# Patient Record
Sex: Male | Born: 1950 | Race: White | Hispanic: No | Marital: Married | Smoking: Former smoker
Health system: Southern US, Community
[De-identification: ages and names within clinical notes are randomized; demographics above are authoritative.]

## PROBLEM LIST (undated history)

## (undated) DIAGNOSIS — N400 Enlarged prostate without lower urinary tract symptoms: Secondary | ICD-10-CM

## (undated) DIAGNOSIS — E119 Type 2 diabetes mellitus without complications: Secondary | ICD-10-CM

## (undated) DIAGNOSIS — I10 Essential (primary) hypertension: Secondary | ICD-10-CM

## (undated) DIAGNOSIS — E7800 Pure hypercholesterolemia, unspecified: Secondary | ICD-10-CM

## (undated) HISTORY — PX: BLADDER SURGERY: SHX569

---

## 2020-04-22 ENCOUNTER — Other Ambulatory Visit: Payer: Self-pay

## 2020-04-22 ENCOUNTER — Encounter (HOSPITAL_COMMUNITY): Payer: Self-pay | Admitting: Emergency Medicine

## 2020-04-22 ENCOUNTER — Inpatient Hospital Stay (HOSPITAL_COMMUNITY)
Admission: EM | Admit: 2020-04-22 | Discharge: 2020-04-27 | DRG: 867 | Disposition: A | Discharge status: Home / Self Care | Payer: Medicare Other | Attending: Internal Medicine | Admitting: Internal Medicine

## 2020-04-22 DIAGNOSIS — E860 Dehydration: Secondary | ICD-10-CM | POA: Diagnosis present

## 2020-04-22 DIAGNOSIS — Z880 Allergy status to penicillin: Secondary | ICD-10-CM

## 2020-04-22 DIAGNOSIS — Z8249 Family history of ischemic heart disease and other diseases of the circulatory system: Secondary | ICD-10-CM

## 2020-04-22 DIAGNOSIS — Z20822 Contact with and (suspected) exposure to covid-19: Secondary | ICD-10-CM | POA: Diagnosis present

## 2020-04-22 DIAGNOSIS — N179 Acute kidney failure, unspecified: Secondary | ICD-10-CM | POA: Diagnosis present

## 2020-04-22 DIAGNOSIS — Z882 Allergy status to sulfonamides status: Secondary | ICD-10-CM

## 2020-04-22 DIAGNOSIS — I2510 Atherosclerotic heart disease of native coronary artery without angina pectoris: Secondary | ICD-10-CM | POA: Diagnosis present

## 2020-04-22 DIAGNOSIS — K219 Gastro-esophageal reflux disease without esophagitis: Secondary | ICD-10-CM | POA: Diagnosis present

## 2020-04-22 DIAGNOSIS — E782 Mixed hyperlipidemia: Secondary | ICD-10-CM | POA: Diagnosis present

## 2020-04-22 DIAGNOSIS — Z884 Allergy status to anesthetic agent status: Secondary | ICD-10-CM

## 2020-04-22 DIAGNOSIS — K5900 Constipation, unspecified: Secondary | ICD-10-CM | POA: Diagnosis not present

## 2020-04-22 DIAGNOSIS — N400 Enlarged prostate without lower urinary tract symptoms: Secondary | ICD-10-CM | POA: Diagnosis present

## 2020-04-22 DIAGNOSIS — N200 Calculus of kidney: Secondary | ICD-10-CM | POA: Diagnosis present

## 2020-04-22 DIAGNOSIS — R531 Weakness: Secondary | ICD-10-CM | POA: Diagnosis not present

## 2020-04-22 DIAGNOSIS — E872 Acidosis: Secondary | ICD-10-CM | POA: Diagnosis present

## 2020-04-22 DIAGNOSIS — E1165 Type 2 diabetes mellitus with hyperglycemia: Secondary | ICD-10-CM | POA: Diagnosis present

## 2020-04-22 DIAGNOSIS — G9341 Metabolic encephalopathy: Secondary | ICD-10-CM | POA: Diagnosis present

## 2020-04-22 DIAGNOSIS — E1169 Type 2 diabetes mellitus with other specified complication: Secondary | ICD-10-CM | POA: Diagnosis present

## 2020-04-22 DIAGNOSIS — R946 Abnormal results of thyroid function studies: Secondary | ICD-10-CM | POA: Diagnosis present

## 2020-04-22 DIAGNOSIS — A770 Spotted fever due to Rickettsia rickettsii: Principal | ICD-10-CM | POA: Diagnosis present

## 2020-04-22 DIAGNOSIS — K921 Melena: Secondary | ICD-10-CM

## 2020-04-22 DIAGNOSIS — Z7984 Long term (current) use of oral hypoglycemic drugs: Secondary | ICD-10-CM

## 2020-04-22 DIAGNOSIS — Z888 Allergy status to other drugs, medicaments and biological substances status: Secondary | ICD-10-CM

## 2020-04-22 DIAGNOSIS — Z79899 Other long term (current) drug therapy: Secondary | ICD-10-CM

## 2020-04-22 DIAGNOSIS — A09 Infectious gastroenteritis and colitis, unspecified: Secondary | ICD-10-CM | POA: Diagnosis present

## 2020-04-22 DIAGNOSIS — Z883 Allergy status to other anti-infective agents status: Secondary | ICD-10-CM

## 2020-04-22 DIAGNOSIS — I10 Essential (primary) hypertension: Secondary | ICD-10-CM | POA: Diagnosis present

## 2020-04-22 DIAGNOSIS — Z87891 Personal history of nicotine dependence: Secondary | ICD-10-CM

## 2020-04-22 HISTORY — DX: Pure hypercholesterolemia, unspecified: E78.00

## 2020-04-22 HISTORY — DX: Essential (primary) hypertension: I10

## 2020-04-22 HISTORY — DX: Type 2 diabetes mellitus without complications: E11.9

## 2020-04-22 HISTORY — DX: Benign prostatic hyperplasia without lower urinary tract symptoms: N40.0

## 2020-04-22 NOTE — ED Triage Notes (Signed)
Patient states he is feeling foggy headed, diarrhea, having trouble remember things, he has to urinate a lot, weak, and a fever since he got his moderna shot. Patient got the second shot on April 14.,2021.

## 2020-04-22 NOTE — ED Provider Notes (Signed)
[facility]-EMERGENCY DEPT Provider Note   CSN: [id number] Arrival date & time: 04/22/20  2254     History Chief Complaint  Patient presents with  . Fever  . Weakness    Gerald Hughes is a 69 y.o. male with pertinent past medical history of hypertension, diabetes, high cholesterol that presents emergency department today for multiple complaints.  He is currently complaining of generalized weakness, diarrhea, subjective fevers for the past week.  He was brought in by his son who is able to provide most of the history.  He states that on April 16 he got the second Covid vaccine and a couple days later he started becoming febrile. Per chart review,  he was seen in the ER 4 days ago at [facility] and they discharged with diagnosis of dehydration and fever of undetermined origin.  CT abdomen negative.  CT chest negative.  Labs were not concerning, besides white count of 17.2.  Normal hemoglobin.  No major electrolyte derangements.  Creatinine 1.36.He was placed on Ceftin twice daily for the next 7 days.    Triage note states that he has been feeling foggy headed and having trouble remember things, however when I asked son he states that he just has been feeling very weak.Son states that he has been taking his temperature at home for the past week, subjective T-max of 103, and it has been elevated consistently.  He states that his temperature today was 99.2 he has not given him any medications for this. Son states that he has also felt that the the patient was more pale.  Patient states that he feels more dehydrated, even though he has been drinking plenty of water.  Patient has been urinating a lot more frequently.  He has been medication compliant.  Denies any nausea or vomiting.  He states that he did have abdominal pain in his upper abdomen earlier today, however it has resolved.  He has been passing gas.  Patient admits to exertional shortness of breath, which has been occurring  since his Covid vaccine.  He denies any chest pain, leg swelling.  He states that he has been having chills at home with occasional night sweats.  He denies any weight changes.  Patient states that he has been having tarry stool for the past couple of days.  He denies any hematochezia.  He denies any dizziness, headache, vision changes, recent tick bites.   HPI     Past Medical History:  Diagnosis Date  . Diabetes mellitus without complication (HCC)   . High cholesterol   . Hypertension   . Prostate enlargement     There are no problems to display for this patient.   Past Surgical History:  Procedure Laterality Date  . BLADDER SURGERY         History reviewed. No pertinent family history.  Social History   Tobacco Use  . Smoking status: Never Smoker  . Smokeless tobacco: Never Used  Substance Use Topics  . Alcohol use: Not Currently  . Drug use: Not Currently    Home Medications Prior to Admission medications   Medication Sig Start Date End Date Taking? Authorizing Provider  acetaminophen (TYLENOL) 325 MG tablet Take 650 mg by mouth every 6 (six) hours as needed for moderate pain.   Yes [provider]  atorvastatin (LIPITOR) 40 MG tablet Take 40 mg by mouth daily.   Yes [provider]  cefUROXime (CEFTIN) 500 MG tablet Take 500 mg by mouth 2 (  two) times daily with a meal.  04/18/20 04/25/20 Yes [provider]  cetirizine (ZYRTEC) 10 MG tablet Take 10 mg by mouth See admin instructions. 1 tablet daily for 2 weeks and then alternate to fexofenadine   Yes [provider]  cholecalciferol (VITAMIN D3) 25 MCG (1000 UNIT) tablet Take 1,000 Units by mouth in the morning and at bedtime.   Yes [provider]  desoximetasone (TOPICORT) 0.05 % cream Apply 1 application topically 2 (two) times daily as needed (Rash).   Yes [provider]  empagliflozin (JARDIANCE) 25 MG TABS tablet Take 25 mg by mouth daily.   Yes [provider]  ezetimibe (ZETIA) 10 MG tablet Take 10 mg by mouth daily.   Yes [provider]  fexofenadine (ALLEGRA) 180 MG tablet Take 180 mg by mouth See admin instructions. 1 tablet every evening for 2 weeks and alternate to cetirizine   Yes [provider]  Glucosamine 500 MG CAPS Take 1,000 mg by mouth in the morning and at bedtime.   Yes [provider]  lisinopril (ZESTRIL) 20 MG tablet Take 20 mg by mouth daily.   Yes [provider]  metFORMIN (GLUCOPHAGE-XR) 500 MG 24 hr tablet Take 1,000 mg by mouth in the morning and at bedtime.   Yes [provider]  sodium chloride (OCEAN) 0.65 % SOLN nasal spray Place 1 spray into both nostrils as needed for congestion.   Yes [provider]  tacrolimus (PROTOPIC) 0.1 % ointment Apply 1 application topically 2 (two) times daily as needed (itching).    Yes [provider]  tamsulosin (FLOMAX) 0.4 MG CAPS capsule Take 0.8 mg by mouth daily after supper.   Yes [provider]  vitamin B-12 (CYANOCOBALAMIN) 1000 MCG tablet Take 1,000 mcg by mouth in the morning and at bedtime.   Yes [provider]  vitamin C (ASCORBIC ACID) 250 MG tablet Take 250 mg by mouth daily.   Yes [provider]    Allergies    Penicillins, Cortisone, Lidocaine, Mercurial derivatives, Neomycin-polymyxin b gu, Other, Scopolamine, Sulfa antibiotics, Thimerosal, Hydrochlorothiazide, Neomycin, and Procaine  Review of Systems   Review of Systems  Constitutional: Positive for activity change, chills, fatigue and fever. Negative for diaphoresis.  HENT: Negative for congestion, sore throat and trouble swallowing.   Eyes: Negative for pain and visual disturbance.  Respiratory: Negative for cough, shortness of breath and wheezing.   Cardiovascular: Negative for chest pain, palpitations and leg swelling.  Gastrointestinal: Positive for diarrhea. Negative for abdominal distention, abdominal  pain, nausea and vomiting.  Genitourinary: Positive for frequency. Negative for difficulty urinating, dysuria and hematuria.  Musculoskeletal: Negative for back pain, neck pain and neck stiffness.  Skin: Negative for pallor.  Neurological: Positive for weakness. Negative for dizziness, tremors, seizures, syncope, facial asymmetry, speech difficulty, light-headedness, numbness and headaches.    Physical Exam Updated Vital Signs BP (!) 132/58   Pulse 83   Temp 98.1 F (36.7 C) (Oral)   Resp 18   Ht 5' 6.5" (1.689 m)   Wt 77.1 kg   SpO2 95%   BMI 27.03 kg/m   Physical Exam Constitutional:      General: He is not in acute distress.    Appearance: Normal appearance. He is not ill-appearing, toxic-appearing or diaphoretic.  HENT:     Mouth/Throat:     Mouth: Mucous membranes are dry.     Pharynx: Oropharynx is clear.  Eyes:     General: No scleral  icterus.    Extraocular Movements: Extraocular movements intact.     Pupils: Pupils are equal, round, and reactive to light.  Cardiovascular:     Rate and Rhythm: Normal rate and regular rhythm.     Pulses: Normal pulses.     Heart sounds: Normal heart sounds.  Pulmonary:     Effort: Pulmonary effort is normal. No respiratory distress.     Breath sounds: Normal breath sounds. No stridor. No wheezing, rhonchi or rales.  Chest:     Chest wall: No tenderness.  Abdominal:     General: Abdomen is flat. There is no distension.     Palpations: Abdomen is soft.     Tenderness: There is no abdominal tenderness. There is no guarding or rebound.  Musculoskeletal:        General: No swelling or tenderness. Normal range of motion.     Cervical back: Normal range of motion and neck supple. No rigidity or tenderness.     Right lower leg: No edema.     Left lower leg: No edema.  Lymphadenopathy:     Cervical: No cervical adenopathy.  Skin:    General: Skin is warm and dry.     Capillary Refill: Capillary refill takes less than 2 seconds.      Coloration: Skin is pale.  Neurological:     General: No focal deficit present.     Mental Status: He is alert and oriented to person, place, and time. Mental status is at baseline.     Cranial Nerves: No cranial nerve deficit.     Sensory: No sensory deficit.     Motor: No weakness.     Coordination: Coordination normal.  Psychiatric:        Mood and Affect: Mood normal.        Behavior: Behavior normal.        Thought Content: Thought content normal.        Judgment: Judgment normal.     ED Results / Procedures / Treatments   Labs (all labs ordered are listed, but only abnormal results are displayed) Labs Reviewed  COMPREHENSIVE METABOLIC PANEL - Abnormal; Notable for the following components:      Result Value   CO2 18 (*)    Glucose, Bld 223 (*)    BUN 25 (*)    Creatinine, Ser 1.25 (*)    Albumin 2.8 (*)    AST 12 (*)    GFR calc non Af Amer 58 (*)    All other components within normal limits  CBC WITH DIFFERENTIAL/PLATELET - Abnormal; Notable for the following components:   WBC 14.5 (*)    Platelets 414 (*)    Neutro Abs 11.2 (*)    Monocytes Absolute 1.5 (*)    Abs Immature Granulocytes 0.53 (*)    All other components within normal limits  URINALYSIS, ROUTINE W REFLEX MICROSCOPIC - Abnormal; Notable for the following components:   Color, Urine STRAW (*)    Glucose, UA >=500 (*)    Ketones, ur 80 (*)    Protein, ur 30 (*)    All other components within normal limits  CBG MONITORING, ED - Abnormal; Notable for the following components:   Glucose-Capillary 189 (*)    All other components within normal limits  POC OCCULT BLOOD, ED - Abnormal; Notable for the following components:   Fecal Occult Bld POSITIVE (*)    All other components within normal limits  C DIFFICILE QUICK SCREEN W PCR REFLEX  SARS CORONAVIRUS 2 (TAT 6-24 HRS)  CULTURE, BLOOD (ROUTINE X 2)  CULTURE, BLOOD (ROUTINE X 2)  GI PATHOGEN PANEL BY PCR, STOOL  LIPASE, BLOOD  LACTIC ACID, PLASMA    LACTIC ACID, PLASMA  RAPID HIV SCREEN (HIV 1/2 AB+AG)  ROCKY MTN SPOTTED FVR ABS PNL(IGG+IGM)  LYME DISEASE, WESTERN BLOT  POC SARS CORONAVIRUS 2 AG -  ED  TROPONIN I (HIGH SENSITIVITY)  TROPONIN I (HIGH SENSITIVITY)    EKG EKG Interpretation  Date/Time:  Saturday Apr 23 2020 00:08:43 EDT Ventricular Rate:  86 PR Interval:    QRS Duration: 92 QT Interval:  363 QTC Calculation: 435 R Axis:   2 Text Interpretation: Sinus rhythm Low voltage, precordial leads RSR' in V1 or V2, right VCD or RVH No old tracing to compare Confirmed by Ward, Baxter Hire [phone]) on 04/23/2020 2:21:54 AM   Radiology CT Head Wo Contrast  Result Date: 04/23/2020 CLINICAL DATA:  Altered mental status EXAM: CT HEAD WITHOUT CONTRAST TECHNIQUE: Contiguous axial images were obtained from the base of the skull through the vertex without intravenous contrast. COMPARISON:  None. FINDINGS: Brain: No evidence of acute territorial infarction, hemorrhage, hydrocephalus,extra-axial collection or mass lesion/mass effect. There is dilatation the ventricles and sulci consistent with age-related atrophy. Low-attenuation changes in the deep white matter consistent with small vessel ischemia. 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 Vascular: No hyperdense vessel or unexpected calcification. Skull: The skull is intact. No fracture or focal lesion identified. Sinuses/Orbits: The visualized paranasal sinuses and mastoid air cells are clear. The orbits and globes intact. Other: None IMPRESSION: No acute intracranial abnormality. 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 Electronically Signed   By: Jonna Clark M.D.   On: 04/23/2020 00:44   DG Chest Port 1 View  Result Date: 04/23/2020 CLINICAL DATA:  Weakness EXAM: PORTABLE CHEST 1 VIEW COMPARISON:  None. FINDINGS: The heart size and mediastinal contours are within normal limits. Both  lungs are clear. The visualized skeletal structures are unremarkable. IMPRESSION: No active disease. Electronically Signed   By: Jonna Clark M.D.   On: 04/23/2020 00:43    Procedures Procedures (including critical care time)  Medications Ordered in ED Medications  sodium chloride 0.9 % bolus 1,000 mL (0 mLs Intravenous Stopped 04/23/20 0206)  sodium chloride 0.9 % bolus 1,000 mL (0 mLs Intravenous Stopped 04/23/20 0400)    ED Course  I have reviewed the triage vital signs and the nursing notes.  Pertinent labs & imaging results that were available during my care of the patient were reviewed by me and considered in my medical decision making (see chart for details).    MDM Rules/Calculators/A&P                     Gerald Hughes is a 68 y.o. male with pertinent past medical history of hypertension, diabetes, high cholesterol that presents emergency department today for subjective fevers, weakness, diarrhea.  Patient has been worked up by [employer] in the ER 4 days ago with mostly negative work-up.  Differential diagnoses is wide.  Unclear source of leukocytosis subjective fevers.Today work-up reveals tarry stool and positive Hemoccult.  Normal hemoglobin.  CMP without electrolyte derangements.  Negative troponin.  Normal lipase.  Negative Covid.  Negative C. difficile.  Urinalysis shows dehydration with glucose.  Labs pending include blood cultures, Lyme disease, [place] spotted fever, GI pathogen panel, HIV.  EKG interpreted by me  shows sinus rhythm.  Chest x-ray and CT  Head interpreted by me shows no acute abnormalities.  Due to patient's weakness, subjective fevers, melena patient to be admitted for observation.  PA-C Ivar Drape spoke to Dr. Leafy Half who will admit the patient.  Patient agreeable. 2 Liters of fluid given. No need for repeat CT abdomen at this time due to pt's unchanged symptoms and extra radiation.  Discussed case with my attending physician who is agreeable to plan to  consult for admission. 4:51 AM discussed case with hospitalist who agrees to accept care of patient.The patient appears reasonably stabilized for admission considering the current resources, flow, and capabilities available in the ED at this time, and I doubt any other [facility] requiring further screening and/or treatment in the ED prior to admission.  I discussed this case with my attending physician, Dr. Elesa Massed, who cosigned this note including patient's presenting symptoms, physical exam, and planned diagnostics and interventions. Attending physician stated agreement with plan or made changes to plan which were implemented.  Attending physician assessed patient at bedside.   Final Clinical Impression(s) / ED Diagnoses Final diagnoses:  Generalized weakness  Melena    Rx / DC Orders ED Discharge Orders    None       Farrel Gordon, PA-C 04/23/20 0501    Ward, Layla Maw, DO 04/23/20 [phone]

## 2020-04-23 ENCOUNTER — Observation Stay (HOSPITAL_COMMUNITY): Payer: Medicare Other

## 2020-04-23 ENCOUNTER — Emergency Department (HOSPITAL_COMMUNITY): Payer: Medicare Other

## 2020-04-23 ENCOUNTER — Encounter (HOSPITAL_COMMUNITY): Payer: Self-pay | Admitting: Internal Medicine

## 2020-04-23 DIAGNOSIS — K219 Gastro-esophageal reflux disease without esophagitis: Secondary | ICD-10-CM | POA: Diagnosis not present

## 2020-04-23 DIAGNOSIS — G9341 Metabolic encephalopathy: Secondary | ICD-10-CM | POA: Diagnosis not present

## 2020-04-23 DIAGNOSIS — I10 Essential (primary) hypertension: Secondary | ICD-10-CM

## 2020-04-23 DIAGNOSIS — E1165 Type 2 diabetes mellitus with hyperglycemia: Secondary | ICD-10-CM | POA: Diagnosis present

## 2020-04-23 DIAGNOSIS — N400 Enlarged prostate without lower urinary tract symptoms: Secondary | ICD-10-CM

## 2020-04-23 DIAGNOSIS — E1169 Type 2 diabetes mellitus with other specified complication: Secondary | ICD-10-CM

## 2020-04-23 DIAGNOSIS — A09 Infectious gastroenteritis and colitis, unspecified: Secondary | ICD-10-CM | POA: Diagnosis not present

## 2020-04-23 DIAGNOSIS — E782 Mixed hyperlipidemia: Secondary | ICD-10-CM

## 2020-04-23 DIAGNOSIS — R531 Weakness: Secondary | ICD-10-CM

## 2020-04-23 HISTORY — DX: Essential (primary) hypertension: I10

## 2020-04-23 HISTORY — DX: Benign prostatic hyperplasia without lower urinary tract symptoms: N40.0

## 2020-04-23 HISTORY — DX: Type 2 diabetes mellitus with other specified complication: E11.69

## 2020-04-23 LAB — CBC WITH DIFFERENTIAL/PLATELET
Abs Immature Granulocytes: 0.53 10*3/uL — ABNORMAL HIGH (ref 0.00–0.07)
Abs Immature Granulocytes: 0.62 10*3/uL — ABNORMAL HIGH (ref 0.00–0.07)
Basophils Absolute: 0.1 10*3/uL (ref 0.0–0.1)
Basophils Absolute: 0.1 10*3/uL (ref 0.0–0.1)
Basophils Relative: 1 %
Basophils Relative: 1 %
Eosinophils Absolute: 0.4 10*3/uL (ref 0.0–0.5)
Eosinophils Absolute: 0.5 10*3/uL (ref 0.0–0.5)
Eosinophils Relative: 3 %
Eosinophils Relative: 3 %
HCT: 38.2 % — ABNORMAL LOW (ref 39.0–52.0)
HCT: 42.7 % (ref 39.0–52.0)
Hemoglobin: 11.9 g/dL — ABNORMAL LOW (ref 13.0–17.0)
Hemoglobin: 13.4 g/dL (ref 13.0–17.0)
Immature Granulocytes: 4 %
Immature Granulocytes: 5 %
Lymphocytes Relative: 5 %
Lymphocytes Relative: 6 %
Lymphs Abs: 0.7 10*3/uL (ref 0.7–4.0)
Lymphs Abs: 0.8 10*3/uL (ref 0.7–4.0)
MCH: 30.5 pg (ref 26.0–34.0)
MCH: 30.6 pg (ref 26.0–34.0)
MCHC: 31.2 g/dL (ref 30.0–36.0)
MCHC: 31.4 g/dL (ref 30.0–36.0)
MCV: 97 fL (ref 80.0–100.0)
MCV: 98.2 fL (ref 80.0–100.0)
Monocytes Absolute: 1.5 10*3/uL — ABNORMAL HIGH (ref 0.1–1.0)
Monocytes Absolute: 1.5 10*3/uL — ABNORMAL HIGH (ref 0.1–1.0)
Monocytes Relative: 10 %
Monocytes Relative: 11 %
Neutro Abs: 10.1 10*3/uL — ABNORMAL HIGH (ref 1.7–7.7)
Neutro Abs: 11.2 10*3/uL — ABNORMAL HIGH (ref 1.7–7.7)
Neutrophils Relative %: 74 %
Neutrophils Relative %: 77 %
Platelets: 382 10*3/uL (ref 150–400)
Platelets: 414 10*3/uL — ABNORMAL HIGH (ref 150–400)
RBC: 3.89 MIL/uL — ABNORMAL LOW (ref 4.22–5.81)
RBC: 4.4 MIL/uL (ref 4.22–5.81)
RDW: 13.9 % (ref 11.5–15.5)
RDW: 14.2 % (ref 11.5–15.5)
WBC: 13.6 10*3/uL — ABNORMAL HIGH (ref 4.0–10.5)
WBC: 14.5 10*3/uL — ABNORMAL HIGH (ref 4.0–10.5)
nRBC: 0 % (ref 0.0–0.2)
nRBC: 0 % (ref 0.0–0.2)

## 2020-04-23 LAB — URINALYSIS, ROUTINE W REFLEX MICROSCOPIC
Bacteria, UA: NONE SEEN
Bilirubin Urine: NEGATIVE
Glucose, UA: >=500 mg/dL — AB
Hgb urine dipstick: NEGATIVE
Ketones, ur: 80 mg/dL — AB
Leukocytes,Ua: NEGATIVE
Nitrite: NEGATIVE
Protein, ur: 30 mg/dL — AB
Specific Gravity, Urine: 1.024 (ref 1.005–1.030)
pH: 5 (ref 5.0–8.0)

## 2020-04-23 LAB — COMPREHENSIVE METABOLIC PANEL
ALT: 17 U/L (ref 0–44)
AST: 12 U/L — ABNORMAL LOW (ref 15–41)
Albumin: 2.8 g/dL — ABNORMAL LOW (ref 3.5–5.0)
Alkaline Phosphatase: 78 U/L (ref 38–126)
Anion gap: 15 (ref 5–15)
BUN: 25 mg/dL — ABNORMAL HIGH (ref 8–23)
CO2: 18 mmol/L — ABNORMAL LOW (ref 22–32)
Calcium: 10 mg/dL (ref 8.9–10.3)
Chloride: 107 mmol/L (ref 98–111)
Creatinine, Ser: 1.25 mg/dL — ABNORMAL HIGH (ref 0.61–1.24)
GFR calc Af Amer: >60 mL/min (ref >60)
GFR calc non Af Amer: 58 mL/min — ABNORMAL LOW (ref >60)
Glucose, Bld: 223 mg/dL — ABNORMAL HIGH (ref 70–99)
Potassium: 3.9 mmol/L (ref 3.5–5.1)
Sodium: 140 mmol/L (ref 135–145)
Total Bilirubin: 1 mg/dL (ref 0.3–1.2)
Total Protein: 7.5 g/dL (ref 6.5–8.1)

## 2020-04-23 LAB — CBC
HCT: 36.5 % — ABNORMAL LOW (ref 39.0–52.0)
Hemoglobin: 11.4 g/dL — ABNORMAL LOW (ref 13.0–17.0)
MCH: 30.7 pg (ref 26.0–34.0)
MCHC: 31.2 g/dL (ref 30.0–36.0)
MCV: 98.4 fL (ref 80.0–100.0)
Platelets: 346 10*3/uL (ref 150–400)
RBC: 3.71 MIL/uL — ABNORMAL LOW (ref 4.22–5.81)
RDW: 14 % (ref 11.5–15.5)
WBC: 12.3 10*3/uL — ABNORMAL HIGH (ref 4.0–10.5)
nRBC: 0 % (ref 0.0–0.2)

## 2020-04-23 LAB — LACTIC ACID, PLASMA
Lactic Acid, Venous: 1 mmol/L (ref 0.5–1.9)
Lactic Acid, Venous: 1.3 mmol/L (ref 0.5–1.9)

## 2020-04-23 LAB — POC SARS CORONAVIRUS 2 AG -  ED: SARS Coronavirus 2 Ag: NEGATIVE

## 2020-04-23 LAB — C DIFFICILE QUICK SCREEN W PCR REFLEX
C Diff antigen: NEGATIVE
C Diff interpretation: NOT DETECTED
C Diff toxin: NEGATIVE

## 2020-04-23 LAB — CBG MONITORING, ED
Glucose-Capillary: 166 mg/dL — ABNORMAL HIGH (ref 70–99)
Glucose-Capillary: 167 mg/dL — ABNORMAL HIGH (ref 70–99)
Glucose-Capillary: 177 mg/dL — ABNORMAL HIGH (ref 70–99)
Glucose-Capillary: 189 mg/dL — ABNORMAL HIGH (ref 70–99)
Glucose-Capillary: 266 mg/dL — ABNORMAL HIGH (ref 70–99)

## 2020-04-23 LAB — TROPONIN I (HIGH SENSITIVITY)
Troponin I (High Sensitivity): 5 ng/L (ref <18)
Troponin I (High Sensitivity): 5 ng/L (ref <18)

## 2020-04-23 LAB — POC OCCULT BLOOD, ED: Fecal Occult Bld: POSITIVE — AB

## 2020-04-23 LAB — RAPID HIV SCREEN (HIV 1/2 AB+AG)
HIV 1/2 Antibodies: NONREACTIVE
HIV-1 P24 Antigen - HIV24: NONREACTIVE

## 2020-04-23 LAB — HEMOGLOBIN A1C
Hgb A1c MFr Bld: 8.4 % — ABNORMAL HIGH (ref 4.8–5.6)
Mean Plasma Glucose: 194.38 mg/dL

## 2020-04-23 LAB — TSH: TSH: 0.214 u[IU]/mL — ABNORMAL LOW (ref 0.350–4.500)

## 2020-04-23 LAB — SARS CORONAVIRUS 2 (TAT 6-24 HRS): SARS Coronavirus 2: NEGATIVE

## 2020-04-23 LAB — C-REACTIVE PROTEIN: CRP: 30.4 mg/dL — ABNORMAL HIGH (ref <1.0)

## 2020-04-23 LAB — LIPASE, BLOOD: Lipase: 26 U/L (ref 11–51)

## 2020-04-23 LAB — VITAMIN B12: Vitamin B-12: 6547 pg/mL — ABNORMAL HIGH (ref 180–914)

## 2020-04-23 LAB — FOLATE: Folate: 11 ng/mL (ref >5.9)

## 2020-04-23 MED ORDER — LACTATED RINGERS IV SOLN: INTRAVENOUS | Status: DC

## 2020-04-23 MED ORDER — LACTATED RINGERS IV SOLN: INTRAVENOUS | Status: AC

## 2020-04-23 MED ORDER — IOHEXOL 300 MG/ML  SOLN
100.0000 mL | Freq: Once | INTRAMUSCULAR | Status: AC | PRN
  Administered 2020-04-23: 100 mL via INTRAVENOUS

## 2020-04-23 MED ORDER — LISINOPRIL 20 MG PO TABS
20.0000 mg | ORAL_TABLET | Freq: Every day | ORAL | Status: DC
  Administered 2020-04-23 – 2020-04-27 (×5): 20 mg via ORAL
  Filled 2020-04-23 (×5): qty 1

## 2020-04-23 MED ORDER — ONDANSETRON HCL 4 MG/2ML IJ SOLN: 4.0000 mg | Freq: Four times a day (QID) | INTRAMUSCULAR | Status: DC | PRN

## 2020-04-23 MED ORDER — ATORVASTATIN CALCIUM 40 MG PO TABS
40.0000 mg | ORAL_TABLET | Freq: Every day | ORAL | Status: DC
  Administered 2020-04-23 – 2020-04-27 (×5): 40 mg via ORAL
  Filled 2020-04-23 (×5): qty 1

## 2020-04-23 MED ORDER — METRONIDAZOLE IN NACL 5-0.79 MG/ML-% IV SOLN
500.0000 mg | Freq: Three times a day (TID) | INTRAVENOUS | Status: DC
  Administered 2020-04-23 – 2020-04-25 (×7): 500 mg via INTRAVENOUS
  Filled 2020-04-23 (×7): qty 100

## 2020-04-23 MED ORDER — ACETAMINOPHEN 650 MG RE SUPP: 650.0000 mg | Freq: Four times a day (QID) | RECTAL | Status: DC | PRN

## 2020-04-23 MED ORDER — CIPROFLOXACIN IN D5W 400 MG/200ML IV SOLN
400.0000 mg | Freq: Two times a day (BID) | INTRAVENOUS | Status: DC
  Administered 2020-04-23 – 2020-04-25 (×5): 400 mg via INTRAVENOUS
  Filled 2020-04-23 (×5): qty 200

## 2020-04-23 MED ORDER — INSULIN ASPART 100 UNIT/ML ~~LOC~~ SOLN
0.0000 [IU] | Freq: Three times a day (TID) | SUBCUTANEOUS | Status: DC
  Administered 2020-04-23: 8 [IU] via SUBCUTANEOUS
  Administered 2020-04-23 (×3): 3 [IU] via SUBCUTANEOUS
  Administered 2020-04-24: 5 [IU] via SUBCUTANEOUS
  Administered 2020-04-24 (×2): 15 [IU] via SUBCUTANEOUS
  Administered 2020-04-24: 3 [IU] via SUBCUTANEOUS
  Administered 2020-04-25 (×2): 5 [IU] via SUBCUTANEOUS
  Administered 2020-04-25: 11 [IU] via SUBCUTANEOUS
  Administered 2020-04-26 (×2): 5 [IU] via SUBCUTANEOUS
  Administered 2020-04-26: 11 [IU] via SUBCUTANEOUS
  Administered 2020-04-26 – 2020-04-27 (×2): 5 [IU] via SUBCUTANEOUS
  Administered 2020-04-27: 8 [IU] via SUBCUTANEOUS
  Filled 2020-04-23: qty 0.15

## 2020-04-23 MED ORDER — ONDANSETRON HCL 4 MG PO TABS: 4.0000 mg | ORAL_TABLET | Freq: Four times a day (QID) | ORAL | Status: DC | PRN

## 2020-04-23 MED ORDER — EZETIMIBE 10 MG PO TABS
10.0000 mg | ORAL_TABLET | Freq: Every day | ORAL | Status: DC
  Administered 2020-04-23 – 2020-04-27 (×5): 10 mg via ORAL
  Filled 2020-04-23 (×5): qty 1

## 2020-04-23 MED ORDER — PANTOPRAZOLE SODIUM 40 MG IV SOLR
40.0000 mg | Freq: Every day | INTRAVENOUS | Status: DC
  Administered 2020-04-23 – 2020-04-26 (×4): 40 mg via INTRAVENOUS
  Filled 2020-04-23 (×4): qty 40

## 2020-04-23 MED ORDER — SODIUM CHLORIDE (PF) 0.9 % IJ SOLN
INTRAMUSCULAR | Status: AC
  Filled 2020-04-23: qty 50

## 2020-04-23 MED ORDER — ALBUTEROL SULFATE (2.5 MG/3ML) 0.083% IN NEBU: 2.5000 mg | INHALATION_SOLUTION | RESPIRATORY_TRACT | Status: DC | PRN

## 2020-04-23 MED ORDER — SODIUM CHLORIDE 0.9 % IV BOLUS
1000.0000 mL | Freq: Once | INTRAVENOUS | Status: AC
  Administered 2020-04-23: 01:00:00 1000 mL via INTRAVENOUS

## 2020-04-23 MED ORDER — VITAMIN B-12 1000 MCG PO TABS
1000.0000 ug | ORAL_TABLET | Freq: Two times a day (BID) | ORAL | Status: DC
  Filled 2020-04-23: qty 1

## 2020-04-23 MED ORDER — TAMSULOSIN HCL 0.4 MG PO CAPS
0.8000 mg | ORAL_CAPSULE | Freq: Every day | ORAL | Status: DC
  Administered 2020-04-23 – 2020-04-26 (×4): 0.8 mg via ORAL
  Filled 2020-04-23 (×4): qty 2

## 2020-04-23 MED ORDER — SODIUM CHLORIDE 0.9 % IV BOLUS
1000.0000 mL | Freq: Once | INTRAVENOUS | Status: AC
  Administered 2020-04-23: 1000 mL via INTRAVENOUS

## 2020-04-23 MED ORDER — ACETAMINOPHEN 325 MG PO TABS
650.0000 mg | ORAL_TABLET | Freq: Four times a day (QID) | ORAL | Status: DC | PRN
  Administered 2020-04-23 – 2020-04-25 (×3): 650 mg via ORAL
  Filled 2020-04-23 (×3): qty 2

## 2020-04-23 NOTE — Evaluation (Signed)
Physical Therapy Evaluation Patient Details Name: Gerald Hughes MRN: [id number] DOB: 1951/05/24 Today's Date: 04/23/2020   History of Present Illness  Pt admitted with 2 weeks ongoing diarrhea and increasing confusion.  Pt with hx of DM and bladder surgery  Clinical Impression  Pt admitted as above and presenting with functional mobility limitations 2* mild generalized weakness and mild ambulatory balance deficits.  Pt states feeling much better since arrival to ED and currently mobilizing with min guard assist for balance.   Pt should progress well to dc home with family assist.    Follow Up Recommendations No PT follow up    Equipment Recommendations  None recommended by PT    Recommendations for Other Services       Precautions / Restrictions Precautions Precautions: Fall Restrictions Weight Bearing Restrictions: No      Mobility  Bed Mobility Overal bed mobility: Needs Assistance Bed Mobility: Supine to Sit;Sit to Supine     Supine to sit: Supervision Sit to supine: Supervision   General bed mobility comments: Increased time but no physical assist  Transfers Overall transfer level: Needs assistance Equipment used: None Transfers: Sit to/from Stand Sit to Stand: Supervision            Ambulation/Gait Ambulation/Gait assistance: Min guard Gait Distance (Feet): 350 Feet Assistive device: None Gait Pattern/deviations: Step-through pattern;Shuffle;Trunk flexed;Wide base of support Gait velocity: mod pace   General Gait Details: Mild general instability with widened BOS to compensate - no overt LOB.  Pt states "this is better than when I came in but not quite back to normal"  Stairs            Wheelchair Mobility    Modified Rankin (Stroke Patients Only)       Balance Overall balance assessment: Needs assistance Sitting-balance support: No upper extremity supported;Feet supported Sitting balance-Leahy Scale: Good     Standing balance support:  No upper extremity supported Standing balance-Leahy Scale: Fair                               Pertinent Vitals/Pain Pain Assessment: No/denies pain    Home Living Family/patient expects to be discharged to:: Private residence Living Arrangements: Spouse/significant other Available Help at Discharge: Family Type of Home: House Home Access: Stairs to enter Entrance Stairs-Rails: None Technical brewer of Steps: 3 Home Layout: One level Home Equipment: Cane - single point      Prior Function Level of Independence: Independent               Hand Dominance        Extremity/Trunk Assessment   Upper Extremity Assessment Upper Extremity Assessment: Generalized weakness    Lower Extremity Assessment Lower Extremity Assessment: Generalized weakness       Communication   Communication: No difficulties  Cognition Arousal/Alertness: Awake/alert Behavior During Therapy: WFL for tasks assessed/performed Overall Cognitive Status: Within Functional Limits for tasks assessed                                        General Comments      Exercises     Assessment/Plan    PT Assessment Patient needs continued PT services  PT Problem List Decreased strength;Decreased activity tolerance;Decreased balance;Decreased mobility       PT Treatment Interventions Gait training;Stair training;Functional mobility training;Therapeutic activities;Therapeutic exercise;Balance training  PT Goals (Current goals can be found in the Care Plan section)  Acute Rehab PT Goals Patient Stated Goal: Regain IND PT Goal Formulation: With patient Time For Goal Achievement: 05/07/20 Potential to Achieve Goals: Good    Frequency Min 3X/week   Barriers to discharge        Co-evaluation               AM-PAC PT "6 Clicks" Mobility  Outcome Measure Help needed turning from your back to your side while in a flat bed without using bedrails?:  None Help needed moving from lying on your back to sitting on the side of a flat bed without using bedrails?: None Help needed moving to and from a bed to a chair (including a wheelchair)?: A Little Help needed standing up from a chair using your arms (e.g., wheelchair or bedside chair)?: A Little Help needed to walk in hospital room?: A Little Help needed climbing 3-5 steps with a railing? : A Little 6 Click Score: 20    End of Session Equipment Utilized During Treatment: Gait belt Activity Tolerance: Patient tolerated treatment well Patient left: in bed;with call bell/phone within reach;with family/visitor present Nurse Communication: Mobility status PT Visit Diagnosis: Difficulty in walking, not elsewhere classified (R26.2)    Time: 9407-6808 PT Time Calculation (min) (ACUTE ONLY): 22 min   Charges:   PT Evaluation $PT Eval Low Complexity: 1 Low          Mauro Kaufmann PT Acute Rehabilitation Services Pager [phone] Office [phone]   Gerald Hughes 04/23/2020, 1:03 PM

## 2020-04-23 NOTE — ED Notes (Signed)
Pt ambulatory to RR 

## 2020-04-23 NOTE — ED Notes (Addendum)
Pts son reports he usually wear CPAP at night to sleep due to sleep apnea. Pts daughter plans to brings pts at home machine.   thompson MD made aware and pt placed on 2L [place] for comfort until CPAP arrives

## 2020-04-23 NOTE — ED Notes (Addendum)
Pt ambulatory to RR. Pt gown and underwear changed.

## 2020-04-23 NOTE — ED Notes (Signed)
Pt in CT.

## 2020-04-23 NOTE — H&P (Signed)
History and Physical    Gerald Hughes JSH:[id number] DOB: Jan 06, 1951 DOA: 04/22/2020  PCP: Pearson Forster, MD  Patient coming from: Home   Chief Complaint:  Chief Complaint  Patient presents with  . Fever  . Weakness     HPI:  69 year old male with past medical history of benign prostatic hyperplasia, diabetes mellitus type 2, hyperlipidemia, hypertension who [facility] emergency department with complaints of diarrhea and confusion.  Patient is a poor historian due to encephalopathy and therefore the majority the history is been obtained from the son who is at the bedside.  Approximately 2 days after the patient received his second dose of his Covid vaccination (4/14) patient began to experience a combination of sore throat, fevers and diarrhea.  Patient describes diarrhea as watery, dark, almost black in color and occurring 3-5 times daily.  Patient states that his fevers were variable but ranged all the way up to 103 F.    Patient denies any associated abdominal pain, nausea, cough, shortness of breath vomiting, sick contacts, confirmed contacts with COVID-19, dysuria or low back pain.  Several days later patient underwent a skin biopsy of his right ear by his outpatient dermatologist and was placed on a course of Ceftin.  Patient symptoms continue to worsen over the next several days until the patient eventually presented to [facility] emergency department on 4/26.  During that work-up, CT imaging of the chest abdomen pelvis failed to identify a acute infectious process.  COVID-19 PCR testing at that time was negative.  Patient was eventually discharged home and instructed to complete his course of Ceftin.  Patient symptoms of watery dark diarrhea continued to persist over the next 5 days.  Patient continued to complain of intermittent fevers.  As the patient's symptoms continue to persist he also began to develop generalized weakness and lethargy.  Yesterday, the  family reports that the patient became visibly confused and lethargic, prompting the family to bring the patient into [facility] emergency department for evaluation.  Upon evaluation in the emergency department patient was found to have a substantial leukocytosis of 14.5 with stool Hemoccult testing being positive.  Patient was hydrated with 2 L of intravenous isotonic fluids.  Because of patient's ongoing diarrhea with concerns by the ER provider for bleeding, clinical dehydration and encephalopathy the hospitalist group has been called to assess the patient for admission the hospital.    Review of Systems: Unable to fully perform due to patient being lethargic and confused.   Past Medical History:  Diagnosis Date  . BPH (benign prostatic hyperplasia) 04/23/2020  . Diabetes mellitus without complication (HCC)   . Essential hypertension 04/23/2020  . High cholesterol   . Hypertension   . Mixed diabetic hyperlipidemia associated with type 2 diabetes mellitus (HCC) 04/23/2020  . Prostate enlargement     Past Surgical History:  Procedure Laterality Date  . BLADDER SURGERY       reports that he has quit smoking. He has never used smokeless tobacco. He reports previous alcohol use. He reports previous drug use.  Allergies  Allergen Reactions  . Penicillins Anaphylaxis    Other reaction(s): Other (See Comments) "put me in ICU for 5 days" "put me in ICU for 5 days"   . Cortisone Rash  . Lidocaine Rash  . Mercurial Derivatives Rash  . Neomycin-Polymyxin B Gu     Other reaction(s): Other OTHER  . Other Rash  . Scopolamine Rash  . Sulfa Antibiotics Rash  .  Thimerosal Rash  . Hydrochlorothiazide Photosensitivity  . Neomycin Rash  . Procaine     Other reaction(s): Unknown Was told from a allergy test    Family History  Problem Relation Age of Onset  . Heart disease Father      Prior to Admission medications   Medication Sig Start Date End Date Taking? Authorizing  Provider  acetaminophen (TYLENOL) 325 MG tablet Take 650 mg by mouth every 6 (six) hours as needed for moderate pain.   Yes [provider]  atorvastatin (LIPITOR) 40 MG tablet Take 40 mg by mouth daily.   Yes [provider]  cefUROXime (CEFTIN) 500 MG tablet Take 500 mg by mouth 2 (two) times daily with a meal.  04/18/20 04/25/20 Yes [provider]  cetirizine (ZYRTEC) 10 MG tablet Take 10 mg by mouth See admin instructions. 1 tablet daily for 2 weeks and then alternate to fexofenadine   Yes [provider]  cholecalciferol (VITAMIN D3) 25 MCG (1000 UNIT) tablet Take 1,000 Units by mouth in the morning and at bedtime.   Yes [provider]  desoximetasone (TOPICORT) 0.05 % cream Apply 1 application topically 2 (two) times daily as needed (Rash).   Yes [provider]  empagliflozin (JARDIANCE) 25 MG TABS tablet Take 25 mg by mouth daily.   Yes [provider]  ezetimibe (ZETIA) 10 MG tablet Take 10 mg by mouth daily.   Yes [provider]  fexofenadine (ALLEGRA) 180 MG tablet Take 180 mg by mouth See admin instructions. 1 tablet every evening for 2 weeks and alternate to cetirizine   Yes [provider]  Glucosamine 500 MG CAPS Take 1,000 mg by mouth in the morning and at bedtime.   Yes [provider]  lisinopril (ZESTRIL) 20 MG tablet Take 20 mg by mouth daily.   Yes [provider]  metFORMIN (GLUCOPHAGE-XR) 500 MG 24 hr tablet Take 1,000 mg by mouth in the morning and at bedtime.   Yes [provider]  sodium chloride (OCEAN) 0.65 % SOLN nasal spray Place 1 spray into both nostrils as needed for congestion.   Yes [provider]  tacrolimus (PROTOPIC) 0.1 % ointment Apply 1 application topically 2 (two) times daily as needed (itching).    Yes [provider]  tamsulosin (FLOMAX) 0.4 MG CAPS capsule Take 0.8 mg by mouth daily after supper.   Yes [provider]    vitamin B-12 (CYANOCOBALAMIN) 1000 MCG tablet Take 1,000 mcg by mouth in the morning and at bedtime.   Yes [provider]  vitamin C (ASCORBIC ACID) 250 MG tablet Take 250 mg by mouth daily.   Yes [provider]    Physical Exam: Vitals:   04/23/20 0100 04/23/20 0200 04/23/20 0300 04/23/20 0400  BP: 108/76 115/63 128/66 (!) 132/58  Pulse: (!) 119 82 80 83  Resp: 17 18 20 18   Temp:      TempSrc:      SpO2: 95% 92% 96% 95%  Weight:      Height:        Constitutional: Lethargic but arousable and oriented x2,, no associated distress.   Skin: no rashes, no lesions, poor skin turgor noted. Eyes: Pupils are equally reactive to light.  No evidence of scleral icterus or conjunctival pallor.  ENMT: Extremely dry mucous membranes noted.  Posterior pharynx clear of any exudate or lesions.   Neck: normal, supple, no masses, no thyromegaly.  No evidence of jugular venous distension.  Respiratory: clear to auscultation bilaterally, no wheezing, no crackles. Normal respiratory effort. No accessory muscle use.  Cardiovascular: Regular rate and rhythm, no murmurs / rubs / gallops. No extremity edema. 2+ pedal pulses. No carotid bruits.  Chest:   Nontender without crepitus or deformity.   Back:   Nontender without crepitus or deformity. Abdomen: Notable hyperactive bowel sounds.  Abdomen is soft and nontender however.  No evidence of intra-abdominal masses.  Musculoskeletal: No joint deformity upper and lower extremities. Good ROM, no contractures. Normal muscle tone.  Neurologic: Patient quite lethargic but arousable, oriented x2.  Patient is moving all 4 extremities spontaneously.  Sensation is grossly intact.  Patient is responsive to verbal and painful stimuli. Psychiatric: Unable to fully assess due to lethargy.  Labs on Admission: I have personally reviewed following labs and imaging studies -   CBC: Recent Labs  Lab 04/23/20 0005  WBC 14.5*  NEUTROABS 11.2*  HGB 13.4   HCT 42.7  MCV 97.0  PLT 161*   Basic Metabolic Panel: Recent Labs  Lab 04/23/20 0005  NA 140  K 3.9  CL 107  CO2 18*  GLUCOSE 223*  BUN 25*  CREATININE 1.25*  CALCIUM 10.0   GFR: Estimated Creatinine Clearance: 49.8 mL/min (A) (by C-G formula based on SCr of 1.25 mg/dL (H)). Liver Function Tests: Recent Labs  Lab 04/23/20 0005  AST 12*  ALT 17  ALKPHOS 78  BILITOT 1.0  PROT 7.5  ALBUMIN 2.8*   Recent Labs  Lab 04/23/20 0005  LIPASE 26   No results for input(s): AMMONIA in the last 168 hours. Coagulation Profile: No results for input(s): INR, PROTIME in the last 168 hours. Cardiac Enzymes: No results for input(s): CKTOTAL, CKMB, CKMBINDEX, TROPONINI in the last 168 hours. BNP (last 3 results) No results for input(s): PROBNP in the last 8760 hours. HbA1C: No results for input(s): HGBA1C in the last 72 hours. CBG: Recent Labs  Lab 04/23/20 0040  GLUCAP 189*   Lipid Profile: No results for input(s): CHOL, HDL, LDLCALC, TRIG, CHOLHDL, LDLDIRECT in the last 72 hours. Thyroid Function Tests: No results for input(s): TSH, T4TOTAL, FREET4, T3FREE, THYROIDAB in the last 72 hours. Anemia Panel: No results for input(s): VITAMINB12, FOLATE, FERRITIN, TIBC, IRON, RETICCTPCT in the last 72 hours. Urine analysis:    Component Value Date/Time   COLORURINE STRAW (A) 04/23/2020 0006   APPEARANCEUR CLEAR 04/23/2020 0006   LABSPEC 1.024 04/23/2020 0006   PHURINE 5.0 04/23/2020 0006   GLUCOSEU >=500 (A) 04/23/2020 0006   HGBUR NEGATIVE 04/23/2020 0006   BILIRUBINUR NEGATIVE 04/23/2020 0006   KETONESUR 80 (A) 04/23/2020 0006   PROTEINUR 30 (A) 04/23/2020 0006   NITRITE NEGATIVE 04/23/2020 0006   LEUKOCYTESUR NEGATIVE 04/23/2020 0006    Radiological Exams on Admission - Personally Reviewed: CT Head Wo Contrast  Result Date: 04/23/2020 CLINICAL DATA:  Altered mental status EXAM: CT HEAD WITHOUT CONTRAST TECHNIQUE: Contiguous axial images were obtained from the base  of the skull through the vertex without intravenous contrast. COMPARISON:  None. FINDINGS: Brain: No evidence of acute territorial infarction, hemorrhage, hydrocephalus,extra-axial collection or mass lesion/mass effect. There is dilatation the ventricles and sulci consistent with age-related atrophy. Low-attenuation changes in the deep white matter consistent with small vessel ischemia. 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 Vascular: No hyperdense vessel or unexpected calcification. Skull: The skull is intact. No fracture or focal lesion identified. Sinuses/Orbits: The visualized paranasal sinuses and mastoid air cells are clear. The orbits and globes intact.  Other: None IMPRESSION: No acute intracranial abnormality. 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 Electronically Signed   By: Jonna ClarkBindu  Avutu M.D.   On: 04/23/2020 00:44   DG Chest Port 1 View  Result Date: 04/23/2020 CLINICAL DATA:  Weakness EXAM: PORTABLE CHEST 1 VIEW COMPARISON:  None. FINDINGS: The heart size and mediastinal contours are within normal limits. Both lungs are clear. The visualized skeletal structures are unremarkable. IMPRESSION: No active disease. Electronically Signed   By: Jonna ClarkBindu  Avutu M.D.   On: 04/23/2020 00:43    EKG: Personally reviewed.  Rhythm is normal sinus rhythm with heart rate of 82.  No dynamic ST segment changes appreciated.  Assessment/Plan Principal Problem:   Acute infectious diarrhea   Patient presenting with an approximate 2-week history of frequent watery diarrhea  Patient has received several courses of antibiotics in the past several weeks raising concern for possible CDiff colitis - however C. difficile testing here in the emergency room is negative.  CT imaging of the abdomen and pelvis [facility] revealed no evidence of diverticulitis,  appendicitis or abscess  Considering patient's continued reports of fevers with substantial leukocytosis of 14.5 here in the emergency department and ongoing diarrhea I must treat patient for suspected acute infectious diarrhea at this time.  GI PCR panel for infectious organisms is pending.  Will place patient temporarily on empiric regimen of intravenous Flagyl and ciprofloxacin.  Clear liquid diet  Blood cultures obtained  Hydrating patient with intravenous isotonic fluids  If patient clinically worsens will consider repeat CT imaging of the abdomen and pelvis.  Positive Stool Hemoccult   ER provider concerned due to patient's reports of dark watery diarrhea and positive Hemoccult.  While patient is indeed complaining of dark-colored diarrhea, the consistency of the stool is not consistent with melena  BUN is not particularly elevated to support an upper GI bleed.  Hemoglobin is currently unremarkable  As a precaution, will perform several serial CBCs as we hydrate the patient since the patient's H&H is likely hemoconcentrated  Positive stool Hemoccult can be addressed in the outpatient setting.  Will reconsider consulting GI in the inpatient setting if there is a dramatic drop in hemoglobin and hematocrit or any evidence of frank bleeding.  Active Problems:   Acute metabolic encephalopathy   Patient presenting with 24-hour history of worsening lethargy and confusion  Is likely secondary to volume depletion -family already reports the patient is somewhat improving here in the emergency room  Tending to hydrate with intravenous isotonic fluids  TSH, folate and vitamin B12 pending.    Uncontrolled type 2 diabetes mellitus with hyperglycemia, without Spratt-term current use of insulin (HCC)   Patient presenting with substantial hyperglycemia  Diabetes likely more uncontrolled than usual due to active infection  Accu-Cheks before every meal and nightly with sliding  scale insulin  Obtaining hemoglobin A1c  Holding home regimen of oral hypoglycemics    Essential hypertension   Continuing home regimen of antihypertensive therapy    GERD without esophagitis   Patient reports bouts of frequent heartburn, will initiate daily PPI  I do not necessarily think the patient is suffering from an upper GI bleed for reasons noted above.    BPH (benign prostatic hyperplasia)   Continuing home regimen of Flomax    Mixed diabetic hyperlipidemia associated with type 2 diabetes mellitus (HCC)   While Lipitor is known to have a  frequent side effect of diarrhea, patient has been on this medication for 20 years and this is unlikely to be the culprit of his diarrhea.  We will therefore continue this medication, along with his Zetia at this time.     Code Status:  Full code Family Communication: Son at the bedside updated on plan of care  Status is: Observation  The patient remains OBS appropriate and will d/c before 2 midnights.  Dispo: The patient is from: Home              Anticipated d/c is to: Home              Anticipated d/c date is: 2 days              Patient currently is not medically stable to d/c.        Marinda Elk MD [facility] Pager [phone]  If 7PM-7AM, please contact night-coverage [email or web address] Use universal [place] password for that web site. If you do not have the password, please call the hospital operator.  04/23/2020, 5:28 AM

## 2020-04-23 NOTE — ED Notes (Signed)
Thompson MD at bedside. 

## 2020-04-23 NOTE — ED Notes (Signed)
Patient provided with lunch tray

## 2020-04-23 NOTE — Progress Notes (Signed)
I seen and assessed patient and agree with Dr Elinor Parkinson assessment and plan. Patient admitted with weakness, confusion, diarrhea. Covid PCR pending. Check CT abd and pelvis. Continue IV antibiotics, IVF, supportive care. PT/OT.  No charge

## 2020-04-23 NOTE — ED Notes (Signed)
Physical therapy at bedside

## 2020-04-24 DIAGNOSIS — E872 Acidosis: Secondary | ICD-10-CM | POA: Diagnosis present

## 2020-04-24 DIAGNOSIS — E1165 Type 2 diabetes mellitus with hyperglycemia: Secondary | ICD-10-CM | POA: Diagnosis present

## 2020-04-24 DIAGNOSIS — N179 Acute kidney failure, unspecified: Secondary | ICD-10-CM | POA: Diagnosis present

## 2020-04-24 DIAGNOSIS — I10 Essential (primary) hypertension: Secondary | ICD-10-CM | POA: Diagnosis present

## 2020-04-24 DIAGNOSIS — K219 Gastro-esophageal reflux disease without esophagitis: Secondary | ICD-10-CM | POA: Diagnosis present

## 2020-04-24 DIAGNOSIS — Z20822 Contact with and (suspected) exposure to covid-19: Secondary | ICD-10-CM | POA: Diagnosis present

## 2020-04-24 DIAGNOSIS — Z7984 Long term (current) use of oral hypoglycemic drugs: Secondary | ICD-10-CM | POA: Diagnosis not present

## 2020-04-24 DIAGNOSIS — N200 Calculus of kidney: Secondary | ICD-10-CM | POA: Diagnosis present

## 2020-04-24 DIAGNOSIS — Z79899 Other long term (current) drug therapy: Secondary | ICD-10-CM | POA: Diagnosis not present

## 2020-04-24 DIAGNOSIS — A770 Spotted fever due to Rickettsia rickettsii: Secondary | ICD-10-CM | POA: Diagnosis present

## 2020-04-24 DIAGNOSIS — N400 Enlarged prostate without lower urinary tract symptoms: Secondary | ICD-10-CM | POA: Diagnosis present

## 2020-04-24 DIAGNOSIS — E1169 Type 2 diabetes mellitus with other specified complication: Secondary | ICD-10-CM | POA: Diagnosis present

## 2020-04-24 DIAGNOSIS — Z87891 Personal history of nicotine dependence: Secondary | ICD-10-CM | POA: Diagnosis not present

## 2020-04-24 DIAGNOSIS — K5900 Constipation, unspecified: Secondary | ICD-10-CM | POA: Diagnosis not present

## 2020-04-24 DIAGNOSIS — R946 Abnormal results of thyroid function studies: Secondary | ICD-10-CM | POA: Diagnosis present

## 2020-04-24 DIAGNOSIS — Z8249 Family history of ischemic heart disease and other diseases of the circulatory system: Secondary | ICD-10-CM | POA: Diagnosis not present

## 2020-04-24 DIAGNOSIS — Z882 Allergy status to sulfonamides status: Secondary | ICD-10-CM | POA: Diagnosis not present

## 2020-04-24 DIAGNOSIS — Z880 Allergy status to penicillin: Secondary | ICD-10-CM | POA: Diagnosis not present

## 2020-04-24 DIAGNOSIS — R531 Weakness: Secondary | ICD-10-CM | POA: Diagnosis present

## 2020-04-24 DIAGNOSIS — Z883 Allergy status to other anti-infective agents status: Secondary | ICD-10-CM | POA: Diagnosis not present

## 2020-04-24 DIAGNOSIS — G9341 Metabolic encephalopathy: Secondary | ICD-10-CM | POA: Diagnosis present

## 2020-04-24 DIAGNOSIS — E782 Mixed hyperlipidemia: Secondary | ICD-10-CM | POA: Diagnosis present

## 2020-04-24 DIAGNOSIS — A09 Infectious gastroenteritis and colitis, unspecified: Secondary | ICD-10-CM | POA: Diagnosis present

## 2020-04-24 DIAGNOSIS — E860 Dehydration: Secondary | ICD-10-CM | POA: Diagnosis present

## 2020-04-24 DIAGNOSIS — I2510 Atherosclerotic heart disease of native coronary artery without angina pectoris: Secondary | ICD-10-CM | POA: Diagnosis present

## 2020-04-24 LAB — COMPREHENSIVE METABOLIC PANEL
ALT: 12 U/L (ref 0–44)
AST: 13 U/L — ABNORMAL LOW (ref 15–41)
Albumin: 1.9 g/dL — ABNORMAL LOW (ref 3.5–5.0)
Alkaline Phosphatase: 55 U/L (ref 38–126)
Anion gap: 9 (ref 5–15)
BUN: 16 mg/dL (ref 8–23)
CO2: 17 mmol/L — ABNORMAL LOW (ref 22–32)
Calcium: 8.5 mg/dL — ABNORMAL LOW (ref 8.9–10.3)
Chloride: 113 mmol/L — ABNORMAL HIGH (ref 98–111)
Creatinine, Ser: 0.9 mg/dL (ref 0.61–1.24)
GFR calc Af Amer: >60 mL/min (ref >60)
GFR calc non Af Amer: >60 mL/min (ref >60)
Glucose, Bld: 129 mg/dL — ABNORMAL HIGH (ref 70–99)
Potassium: 3.9 mmol/L (ref 3.5–5.1)
Sodium: 139 mmol/L (ref 135–145)
Total Bilirubin: 0.7 mg/dL (ref 0.3–1.2)
Total Protein: 4.9 g/dL — ABNORMAL LOW (ref 6.5–8.1)

## 2020-04-24 LAB — CBC WITH DIFFERENTIAL/PLATELET
Abs Immature Granulocytes: 0.56 10*3/uL — ABNORMAL HIGH (ref 0.00–0.07)
Basophils Absolute: 0.1 10*3/uL (ref 0.0–0.1)
Basophils Relative: 0 %
Eosinophils Absolute: 0.6 10*3/uL — ABNORMAL HIGH (ref 0.0–0.5)
Eosinophils Relative: 5 %
HCT: 35.9 % — ABNORMAL LOW (ref 39.0–52.0)
Hemoglobin: 10.9 g/dL — ABNORMAL LOW (ref 13.0–17.0)
Immature Granulocytes: 5 %
Lymphocytes Relative: 7 %
Lymphs Abs: 0.8 10*3/uL (ref 0.7–4.0)
MCH: 30.4 pg (ref 26.0–34.0)
MCHC: 30.4 g/dL (ref 30.0–36.0)
MCV: 100.3 fL — ABNORMAL HIGH (ref 80.0–100.0)
Monocytes Absolute: 1.4 10*3/uL — ABNORMAL HIGH (ref 0.1–1.0)
Monocytes Relative: 12 %
Neutro Abs: 8.1 10*3/uL — ABNORMAL HIGH (ref 1.7–7.7)
Neutrophils Relative %: 71 %
Platelets: 336 10*3/uL (ref 150–400)
RBC: 3.58 MIL/uL — ABNORMAL LOW (ref 4.22–5.81)
RDW: 14.2 % (ref 11.5–15.5)
WBC: 11.5 10*3/uL — ABNORMAL HIGH (ref 4.0–10.5)
nRBC: 0 % (ref 0.0–0.2)

## 2020-04-24 LAB — GLUCOSE, CAPILLARY
Glucose-Capillary: 155 mg/dL — ABNORMAL HIGH (ref 70–99)
Glucose-Capillary: 361 mg/dL — ABNORMAL HIGH (ref 70–99)
Glucose-Capillary: 230 mg/dL — ABNORMAL HIGH (ref 70–99)
Glucose-Capillary: 412 mg/dL — ABNORMAL HIGH (ref 70–99)

## 2020-04-24 LAB — GLUCOSE, RANDOM: Glucose, Bld: 380 mg/dL — ABNORMAL HIGH (ref 70–99)

## 2020-04-24 LAB — MAGNESIUM: Magnesium: 2 mg/dL (ref 1.7–2.4)

## 2020-04-24 MED ORDER — INSULIN GLARGINE 100 UNIT/ML ~~LOC~~ SOLN
8.0000 [IU] | Freq: Every day | SUBCUTANEOUS | Status: DC
  Administered 2020-04-24: 8 [IU] via SUBCUTANEOUS
  Filled 2020-04-24 (×2): qty 0.08

## 2020-04-24 MED ORDER — BISACODYL 10 MG RE SUPP: 10.0000 mg | Freq: Once | RECTAL | Status: DC

## 2020-04-24 MED ORDER — PRO-STAT SUGAR FREE PO LIQD
30.0000 mL | Freq: Every day | ORAL | Status: DC
  Administered 2020-04-24 – 2020-04-26 (×3): 30 mL via ORAL
  Filled 2020-04-24 (×3): qty 30

## 2020-04-24 MED ORDER — ADULT MULTIVITAMIN W/MINERALS CH
1.0000 | ORAL_TABLET | Freq: Every day | ORAL | Status: DC
  Administered 2020-04-24 – 2020-04-27 (×4): 1 via ORAL
  Filled 2020-04-24 (×4): qty 1

## 2020-04-24 MED ORDER — SORBITOL 70 % SOLN
30.0000 mL | Status: AC
  Administered 2020-04-24 (×2): 30 mL via ORAL
  Filled 2020-04-24 (×2): qty 30

## 2020-04-24 MED ORDER — SODIUM BICARBONATE 650 MG PO TABS
650.0000 mg | ORAL_TABLET | Freq: Two times a day (BID) | ORAL | Status: AC
  Administered 2020-04-24 – 2020-04-25 (×4): 650 mg via ORAL
  Filled 2020-04-24 (×4): qty 1

## 2020-04-24 MED ORDER — BOOST / RESOURCE BREEZE PO LIQD CUSTOM
1.0000 | Freq: Three times a day (TID) | ORAL | Status: DC
  Administered 2020-04-24 – 2020-04-25 (×3): 1 via ORAL

## 2020-04-24 NOTE — Progress Notes (Signed)
Initial Nutrition Assessment  RD working remotely.   DOCUMENTATION CODES:   Not applicable  INTERVENTION:  - continue Boost Breeze TID, each supplement provides 250 kcal and 9 grams of protein. - will order Magic Cup with lunch meals, each supplement provides 290 kcal and 9 grams of protein. - will order 30 mL Prostat BID, each supplement provides 100 kcal and 15 grams of protein. - will order daily multivitamin with minerals.    NUTRITION DIAGNOSIS:   Inadequate oral intake related to acute illness, diarrhea as evidenced by per patient/family report.  GOAL:   Patient will meet greater than or equal to 90% of their needs  MONITOR:   PO intake, Supplement acceptance, Diet advancement, Labs, Weight trends  REASON FOR ASSESSMENT:   Malnutrition Screening Tool  ASSESSMENT:   69 year old male with medical history of BPH, type 2 DM, hyperlipidemia, and HTN. He presented to the ED with complaints of diarrhea and confusion. In the ED, information was obtained from patient's son who stated that patient received second COVID vaccine on 4/14 and after that time developed sore throat, fever, and diarrhea 3-5 times/day. Patient had skin biopsy near R ear a few days after vaccine. Patient went to the ED on 4/26 but was discharged home. He returned to the ED on 4/30 as symptoms continued/worsened.  Diet advanced from CLD to FLD today at 0750; no intakes documented since admission. Weight in the ED on 4/30 was 170 lb, which appears to be a stated weight. Weight on 04/18/20 at [facility] was 158 lb. No other weight hx available in the chart. No documentation in the flow sheet concerning edema.  Per notes: - acute infectious diarrhea with frequent episodes x2 weeks - c.diff negative; pending GI PCR panel - acute metabolic encephalopathy - Observation status   Labs reviewed; CBG: 155 mg/dl, Cl: 937 mmol/l, Ca: 8.5 mg/dl. Medications reviewed; sliding scale novolog, 40 mg IV protonix/day, 650 mg  oral sodium bicarb BID.     NUTRITION - FOCUSED PHYSICAL EXAM:  unable to complete at this time.   Diet Order:   Diet Order            Diet full liquid Room service appropriate? Yes; Fluid consistency: Thin  Diet effective now              EDUCATION NEEDS:   Not appropriate for education at this time  Skin:  Skin Assessment: Reviewed RN Assessment  Last BM:  5/2  Height:   Ht Readings from Last 1 Encounters:  04/22/20 5' 6.5" (1.689 m)    Weight:   Wt Readings from Last 1 Encounters:  04/22/20 77.1 kg    Estimated Nutritional Needs:  Kcal:  1900-2100 kcal Protein:  80-95 grams Fluid:  >/= 2.5 L/day     Trenton Gammon, MS, RD, LDN, CNSC Inpatient Clinical Dietitian RD pager # available in AMION  After hours/weekend pager # available in [facility]

## 2020-04-24 NOTE — Progress Notes (Signed)
PROGRESS NOTE    Gerald Hughes  OFB:[id number] DOB: [birth year] DOA: 04/22/2020 PCP: Pearson Forster, MD    Chief Complaint  Patient presents with   Fever   Weakness    Brief Narrative:  Patient 69 year old gentleman history of BPH, type 2 diabetes, hyperlipidemia, hypertension presented with diarrhea confusion lethargy and weakness.  COVID-19 PCR negative.  C. difficile PCR negative.  CT abdomen and pelvis with no acute findings to explain diarrhea fever, moderate burden of stool throughout the colon, nonobstructive left nephrolithiasis, coronary artery disease.  GI pathogen panel pending.  Patient placed empirically on IV ciprofloxacin and IV Flagyl for presumed infectious diarrhea.  Patient also placed on IV fluids for dehydration.   Assessment & Plan:   Principal Problem:   Acute infectious diarrhea Active Problems:   Acute metabolic encephalopathy   Uncontrolled type 2 diabetes mellitus with hyperglycemia, without Retzloff-term current use of insulin (HCC)   BPH (benign prostatic hyperplasia)   Mixed diabetic hyperlipidemia associated with type 2 diabetes mellitus (HCC)   Essential hypertension   GERD without esophagitis  1 acute presumed infectious diarrhea Patient had presented with weakness, frequent watery diarrhea, noted to have received several courses of antibiotics in the past several weeks raising concern for C. difficile colitis.  C. difficile PCR was negative.  GI pathogen panel pending.  Leukocytosis trending down.  CT abdomen and pelvis done negative for any acute abnormalities however did show moderate stool burden.  Concerned that patient may have watery stool seeping around moderate stool burden.  Patient improving clinically with IV fluids and empirically on IV ciprofloxacin and IV Flagyl.  We will give some laxatives for moderate stool burden and follow.  May need a enema.  Continue supportive care.  Advance diet to a full liquid diet and if tolerated likely to a  soft diet tomorrow.  2.  Acute metabolic encephalopathy Patient had presented with a 1 day history of worsening lethargy and confusion felt likely secondary to volume depletion in the setting of probable infectious diarrhea.  Vitamin B12 levels elevated at 6547.  TSH at 0.214.  Folate within normal limits at 11.  Improving clinically.  Continue empiric IV antibiotics.  Supportive care.  3.  Acidosis Bicarb tablets.  4.  Type 2 diabetes mellitus with hyperglycemia Hemoglobin A1c 8.4.  CBG of 155 this morning and 361 around lunchtime.  Placed on Givler-acting Lantus 80 units daily.  Continue sliding scale insulin.  Continue to hold oral hypoglycemic agents.  Follow.  5.  BPH Flomax.  6.  Hyperlipidemia Continue statin, Zetia.  7.  Abnormal TSH TSH noted at 0.214.  Will likely need repeat thyroid studies done in the outpatient setting post discharge.  8.  GERD Continue PPI.  9.  Hypertension Continue lisinopril.  10.  Generalized weakness Likely secondary to problem #1.  Slowly improving.  PT/OT.   DVT prophylaxis: SCDs Code Status: Full Family Communication: Updated patient, wife, daughter at bedside. Disposition:   Status is: Observation    Dispo: The patient is from: Home              Anticipated d/c is to: Home              Anticipated d/c date is: To be determined hopefully 1 to 2 days.              Patient currently on IV antibiotics.       Consultants:   None  Procedures:   CT head 04/23/2020  CT abdomen and pelvis 04/23/2020  Antimicrobials:   IV ciprofloxacin 04/23/2020  IV Flagyl 04/23/2020   Subjective: In bed with CPAP on.  Alert.  Denies any chest pain or shortness of breath.  Stated had a very small stool.  Denies any abdominal pain.  Tolerating clears.  Objective: Vitals:   04/24/20 0200 04/24/20 0202 04/24/20 0251 04/24/20 0544  BP: (!) 99/58  (!) 103/56 112/65  Pulse: 77  64 64  Resp: 17  17 16   Temp:  97.6 F (36.4 C) 98.1 F (36.7 C)  98 F (36.7 C)  TempSrc:  Oral Oral   SpO2: 93%  95% 100%  Weight:      Height:        Intake/Output Summary (Last 24 hours) at 04/24/2020 0947 Last data filed at 04/24/2020 0918 Gross per 24 hour  Intake 1797.39 ml  Output 800 ml  Net 997.39 ml   Filed Weights   04/22/20 2312  Weight: 77.1 kg    Examination:  General exam: Appears calm and comfortable  Respiratory system: Clear to auscultation. Respiratory effort normal. Cardiovascular system: S1 & S2 heard, RRR. No JVD, murmurs, rubs, gallops or clicks. No pedal edema. Gastrointestinal system: Abdomen is nondistended, soft and nontender. No organomegaly or masses felt. Normal bowel sounds heard. Central nervous system: Alert and oriented. No focal neurological deficits. Extremities: Symmetric 5 x 5 power. Skin: No rashes, lesions or ulcers Psychiatry: Judgement and insight appear normal. Mood & affect appropriate.     Data Reviewed: I have personally reviewed following labs and imaging studies  CBC: Recent Labs  Lab 04/23/20 0005 04/23/20 0600 04/23/20 1134 04/24/20 0350  WBC 14.5* 13.6* 12.3* 11.5*  NEUTROABS 11.2* 10.1*  --  8.1*  HGB 13.4 11.9* 11.4* 10.9*  HCT 42.7 38.2* 36.5* 35.9*  MCV 97.0 98.2 98.4 100.3*  PLT 414* 382 346 336    Basic Metabolic Panel: Recent Labs  Lab 04/23/20 0005 04/24/20 0350  NA 140 139  K 3.9 3.9  CL 107 113*  CO2 18* 17*  GLUCOSE 223* 129*  BUN 25* 16  CREATININE 1.25* 0.90  CALCIUM 10.0 8.5*  MG  --  2.0    GFR: Estimated Creatinine Clearance: 69.2 mL/min (by C-G formula based on SCr of 0.9 mg/dL).  Liver Function Tests: Recent Labs  Lab 04/23/20 0005 04/24/20 0350  AST 12* 13*  ALT 17 12  ALKPHOS 78 55  BILITOT 1.0 0.7  PROT 7.5 4.9*  ALBUMIN 2.8* 1.9*    CBG: Recent Labs  Lab 04/23/20 0814 04/23/20 1130 04/23/20 1708 04/23/20 2226 04/24/20 0739  GLUCAP 166* 167* 177* 266* 155*     Recent Results (from the past 240 hour(s))  C Difficile  Quick Screen w PCR reflex     Status: None   Collection Time: 04/23/20 12:16 AM   Specimen: Stool  Result Value Ref Range Status   C Diff antigen NEGATIVE NEGATIVE Final   C Diff toxin NEGATIVE NEGATIVE Final   C Diff interpretation No C. difficile detected.  Final    Comment: Performed at [facility], [address], [place], [place]  SARS CORONAVIRUS 2 (TAT 6-24 HRS) Nasopharyngeal Nasopharyngeal Swab     Status: None   Collection Time: 04/23/20  2:04 AM   Specimen: Nasopharyngeal Swab  Result Value Ref Range Status   SARS Coronavirus 2 NEGATIVE NEGATIVE Final    Comment: (NOTE) SARS-CoV-2 target nucleic acids are NOT DETECTED. The SARS-CoV-2 RNA is generally detectable  in upper and lower respiratory specimens during the acute phase of infection. Negative results do not preclude SARS-CoV-2 infection, do not rule out co-infections with other pathogens, and should not be used as the sole basis for treatment or other patient management decisions. Negative results must be combined with clinical observations, patient history, and epidemiological information. The expected result is Negative. Fact Sheet for Patients: SugarRoll.be Fact Sheet for Healthcare Providers: https://www.woods-mathews.com/ This test is not yet approved or cleared by the [place] FDA and  has been authorized for detection and/or diagnosis of SARS-CoV-2 by FDA under an Emergency Use Authorization (EUA). This EUA will remain  in effect (meaning this test can be used) for the duration of the COVID-19 declaration under Section 56 4(b)(1) of the Act, 21 U.S.C. section 360bbb-3(b)(1), unless the authorization is terminated or revoked sooner. Performed at [facility], [place] [address], [place], [place] [address]   Blood culture (routine x 2)     Status: None (Preliminary result)   Collection Time: 04/23/20  2:30 AM   Specimen: BLOOD RIGHT  HAND  Result Value Ref Range Status   Specimen Description   Final    BLOOD RIGHT HAND Performed at [facility] [address] [place], [place] [address]    Special Requests   Final    BOTTLES DRAWN AEROBIC AND ANAEROBIC Blood Culture adequate volume Performed at [facility] [address], [place], [place] [address]    Culture   Final    NO GROWTH 1 DAY Performed at [facility], [place] [address] [place], [place] [address]    Report Status PENDING  Incomplete  Blood culture (routine x 2)     Status: None (Preliminary result)   Collection Time: 04/23/20  2:30 AM   Specimen: BLOOD  Result Value Ref Range Status   Specimen Description   Final    BLOOD RIGHT ARM Performed at [facility] [address] [place], [place] [address]    Special Requests   Final    BOTTLES DRAWN AEROBIC AND ANAEROBIC Blood Culture adequate volume Performed at [facility] [address], [place], [place] [address]    Culture   Final    NO GROWTH 1 DAY Performed at [facility], [place] [address], [place], [place] [address]    Report Status PENDING  Incomplete         Radiology Studies: CT Head Wo Contrast  Result Date: 04/23/2020 CLINICAL DATA:  Altered mental status EXAM: CT HEAD WITHOUT CONTRAST TECHNIQUE: Contiguous axial images were obtained from the base of the skull through the vertex without intravenous contrast. COMPARISON:  None. FINDINGS: Brain: No evidence of acute territorial infarction, hemorrhage, hydrocephalus,extra-axial collection or mass lesion/mass effect. There is dilatation the ventricles and sulci consistent with age-related atrophy. Low-attenuation changes in the deep white matter consistent with small vessel ischemia. 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 Vascular: No hyperdense vessel or unexpected calcification. Skull: The skull is intact. No fracture or focal lesion  identified. Sinuses/Orbits: The visualized paranasal sinuses and mastoid air cells are clear. The orbits and globes intact. Other: None IMPRESSION: No acute intracranial abnormality. 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 Electronically Signed   By: Prudencio Pair M.D.   On: 04/23/2020 00:44   CT ABDOMEN PELVIS W CONTRAST  Result Date: 04/23/2020 CLINICAL DATA:  Diarrhea, fever, confusion  EXAM: CT ABDOMEN AND PELVIS WITH CONTRAST TECHNIQUE: Multidetector CT imaging of the abdomen and pelvis was performed using the standard protocol following bolus administration of intravenous contrast. CONTRAST:  OMNIPAQUE IOHEXOL 300 MG/ML  SOLN COMPARISON:  None. FINDINGS: Lower chest: No acute abnormality. Three-vessel coronary artery calcifications. Hepatobiliary: No solid liver abnormality is seen. No gallstones, gallbladder wall thickening, or biliary dilatation. Pancreas: Unremarkable. No pancreatic ductal dilatation or surrounding inflammatory changes. Spleen: Normal in size without significant abnormality. Adrenals/Urinary Tract: Adrenal glands are unremarkable. Multiple nonobstructive left renal calculi. No hydronephrosis. Bladder is unremarkable. Stomach/Bowel: Stomach is within normal limits. Appendix appears normal. No evidence of bowel wall thickening, distention, or inflammatory changes. Moderate burden of stool throughout the colon. Vascular/Lymphatic: Aortic atherosclerosis. No enlarged abdominal or pelvic lymph nodes. Reproductive: No mass or other significant abnormality. Other: Status post right inguinal hernia repair. No abdominopelvic ascites. Musculoskeletal: No acute or significant osseous findings. IMPRESSION: 1. No acute CT findings of the abdomen or pelvis to explain diarrhea or fever. Moderate burden of stool throughout the colon. 2.  Nonobstructive left nephrolithiasis. 3.  Coronary artery disease.   Aortic Atherosclerosis (ICD10-I70.0). Electronically Signed   By: Lauralyn Primes M.D.   On: 04/23/2020 14:19   DG Chest Port 1 View  Result Date: 04/23/2020 CLINICAL DATA:  Weakness EXAM: PORTABLE CHEST 1 VIEW COMPARISON:  None. FINDINGS: The heart size and mediastinal contours are within normal limits. Both lungs are clear. The visualized skeletal structures are unremarkable. IMPRESSION: No active disease. Electronically Signed   By: Jonna Clark M.D.   On: 04/23/2020 00:43        Scheduled Meds:  atorvastatin  40 mg Oral Daily   ezetimibe  10 mg Oral Daily   feeding supplement  1 Container Oral TID BM   insulin aspart  0-15 Units Subcutaneous TID AC & HS   lisinopril  20 mg Oral Daily   pantoprazole (PROTONIX) IV  40 mg Intravenous Daily   sodium bicarbonate  650 mg Oral BID   tamsulosin  0.8 mg Oral QPC supper   Continuous Infusions:  ciprofloxacin 400 mg (04/24/20 0809)   lactated ringers 125 mL/hr at 04/24/20 0600   metronidazole 500 mg (04/24/20 0655)     LOS: 0 days    Time spent: 35 minutes    Ramiro Harvest, MD [facility]   To contact the attending provider between 7A-7P or the covering provider during after hours 7P-7A, please log into the web site [email or web address] and access using universal [place] password for that web site. If you do not have the password, please call the hospital operator.  04/24/2020, 9:47 AM

## 2020-04-24 NOTE — Plan of Care (Signed)
  Problem: Clinical Measurements: Goal: Cardiovascular complication will be avoided Outcome: Progressing   Problem: Activity: Goal: Risk for activity intolerance will decrease Outcome: Progressing   Problem: Nutrition: Goal: Adequate nutrition will be maintained Outcome: Progressing   Problem: Coping: Goal: Level of anxiety will decrease Outcome: Progressing   Problem: Elimination: Goal: Will not experience complications related to urinary retention Outcome: Progressing   

## 2020-04-24 NOTE — Progress Notes (Signed)
RT set up patients home cpap

## 2020-04-25 DIAGNOSIS — A09 Infectious gastroenteritis and colitis, unspecified: Secondary | ICD-10-CM | POA: Diagnosis not present

## 2020-04-25 DIAGNOSIS — R531 Weakness: Secondary | ICD-10-CM | POA: Diagnosis not present

## 2020-04-25 DIAGNOSIS — N400 Enlarged prostate without lower urinary tract symptoms: Secondary | ICD-10-CM | POA: Diagnosis not present

## 2020-04-25 DIAGNOSIS — G9341 Metabolic encephalopathy: Secondary | ICD-10-CM | POA: Diagnosis not present

## 2020-04-25 LAB — CBC
HCT: 37.9 % — ABNORMAL LOW (ref 39.0–52.0)
Hemoglobin: 12.1 g/dL — ABNORMAL LOW (ref 13.0–17.0)
MCH: 30.5 pg (ref 26.0–34.0)
MCHC: 31.9 g/dL (ref 30.0–36.0)
MCV: 95.5 fL (ref 80.0–100.0)
Platelets: 452 10*3/uL — ABNORMAL HIGH (ref 150–400)
RBC: 3.97 MIL/uL — ABNORMAL LOW (ref 4.22–5.81)
RDW: 14.3 % (ref 11.5–15.5)
WBC: 13.4 10*3/uL — ABNORMAL HIGH (ref 4.0–10.5)
nRBC: 0 % (ref 0.0–0.2)

## 2020-04-25 LAB — GLUCOSE, CAPILLARY
Glucose-Capillary: 217 mg/dL — ABNORMAL HIGH (ref 70–99)
Glucose-Capillary: 315 mg/dL — ABNORMAL HIGH (ref 70–99)
Glucose-Capillary: 234 mg/dL — ABNORMAL HIGH (ref 70–99)
Glucose-Capillary: 403 mg/dL — ABNORMAL HIGH (ref 70–99)

## 2020-04-25 LAB — BASIC METABOLIC PANEL
Anion gap: 10 (ref 5–15)
BUN: 14 mg/dL (ref 8–23)
CO2: 21 mmol/L — ABNORMAL LOW (ref 22–32)
Calcium: 9.2 mg/dL (ref 8.9–10.3)
Chloride: 112 mmol/L — ABNORMAL HIGH (ref 98–111)
Creatinine, Ser: 1.06 mg/dL (ref 0.61–1.24)
GFR calc Af Amer: >60 mL/min (ref >60)
GFR calc non Af Amer: >60 mL/min (ref >60)
Glucose, Bld: 202 mg/dL — ABNORMAL HIGH (ref 70–99)
Potassium: 3.4 mmol/L — ABNORMAL LOW (ref 3.5–5.1)
Sodium: 143 mmol/L (ref 135–145)

## 2020-04-25 LAB — GLUCOSE, RANDOM: Glucose, Bld: 443 mg/dL — ABNORMAL HIGH (ref 70–99)

## 2020-04-25 LAB — MAGNESIUM: Magnesium: 2.1 mg/dL (ref 1.7–2.4)

## 2020-04-25 MED ORDER — INSULIN ASPART 100 UNIT/ML ~~LOC~~ SOLN
5.0000 [IU] | Freq: Once | SUBCUTANEOUS | Status: AC
  Administered 2020-04-25: 5 [IU] via SUBCUTANEOUS

## 2020-04-25 MED ORDER — INSULIN ASPART 100 UNIT/ML ~~LOC~~ SOLN
4.0000 [IU] | Freq: Three times a day (TID) | SUBCUTANEOUS | Status: DC
  Administered 2020-04-25 – 2020-04-27 (×8): 4 [IU] via SUBCUTANEOUS

## 2020-04-25 MED ORDER — POTASSIUM CHLORIDE CRYS ER 20 MEQ PO TBCR
40.0000 meq | EXTENDED_RELEASE_TABLET | Freq: Once | ORAL | Status: AC
  Administered 2020-04-25: 08:00:00 40 meq via ORAL
  Filled 2020-04-25: qty 2

## 2020-04-25 MED ORDER — METRONIDAZOLE 500 MG PO TABS
500.0000 mg | ORAL_TABLET | Freq: Three times a day (TID) | ORAL | Status: DC
  Administered 2020-04-25 – 2020-04-26 (×3): 500 mg via ORAL
  Filled 2020-04-25 (×3): qty 1

## 2020-04-25 MED ORDER — INSULIN GLARGINE 100 UNIT/ML ~~LOC~~ SOLN
12.0000 [IU] | Freq: Every day | SUBCUTANEOUS | Status: DC
  Administered 2020-04-25: 12 [IU] via SUBCUTANEOUS
  Filled 2020-04-25 (×2): qty 0.12

## 2020-04-25 MED ORDER — CIPROFLOXACIN HCL 500 MG PO TABS
500.0000 mg | ORAL_TABLET | Freq: Two times a day (BID) | ORAL | Status: DC
  Administered 2020-04-25 – 2020-04-26 (×2): 500 mg via ORAL
  Filled 2020-04-25 (×3): qty 1

## 2020-04-25 NOTE — Progress Notes (Signed)
Physical Therapy Treatment Patient Details Name: Gerald Hughes MRN: [id number] DOB: 1951-05-30 Today's Date: 04/25/2020    History of Present Illness Pt admitted with 2 weeks ongoing diarrhea and increasing confusion.  Pt with hx of DM and bladder surgery    PT Comments    Pt is making steady progress with mobility. Pt is mod independent with bed mobility. Pt is walking on the unit with supervision secondary to 1 LOB while leaning over the bed to place a pillow on the bed. Pt will have assistance at home when needed. Recommend d/c home when medically stable. Pt will continue to benefit from acute skilled PT until d/c home or mod Indep goals met.  Follow Up Recommendations  No PT follow up     Equipment Recommendations  None recommended by PT    Recommendations for Other Services       Precautions / Restrictions Precautions Precautions: Fall    Mobility  Bed Mobility Overal bed mobility: Modified Independent Bed Mobility: Supine to Sit     Supine to sit: Modified independent (Device/Increase time) Sit to supine: Modified independent (Device/Increase time)      Transfers Overall transfer level: Needs assistance   Transfers: Sit to/from Stand Sit to Stand: Supervision            Ambulation/Gait Ambulation/Gait assistance: Supervision Gait Distance (Feet): 350 Feet Assistive device: None Gait Pattern/deviations: Step-through pattern;Shuffle;Trunk flexed;Wide base of support Gait velocity: decreased   General Gait Details: Pt had 1 LOB at bed when leaning over to put neck pillow on the bed. No LOB during gait on the unit; therefore, kept pt as supervision   Stairs             Wheelchair Mobility    Modified Rankin (Stroke Patients Only)       Balance Overall balance assessment: Needs assistance Sitting-balance support: No upper extremity supported Sitting balance-Leahy Scale: Normal     Standing balance support: No upper extremity  supported Standing balance-Leahy Scale: Good                              Cognition Arousal/Alertness: Awake/alert Behavior During Therapy: WFL for tasks assessed/performed Overall Cognitive Status: Within Functional Limits for tasks assessed                                        Exercises      General Comments        Pertinent Vitals/Pain Pain Assessment: No/denies pain    Home Living                      Prior Function            PT Goals (current goals can now be found in the care plan section) Progress towards PT goals: Progressing toward goals    Frequency    Min 3X/week      PT Plan Current plan remains appropriate    Co-evaluation              AM-PAC PT "6 Clicks" Mobility   Outcome Measure  Help needed turning from your back to your side while in a flat bed without using bedrails?: None Help needed moving from lying on your back to sitting on the side of a flat bed without using bedrails?: None Help needed moving  to and from a bed to a chair (including a wheelchair)?: None Help needed standing up from a chair using your arms (e.g., wheelchair or bedside chair)?: None Help needed to walk in hospital room?: A Little Help needed climbing 3-5 steps with a railing? : A Little 6 Click Score: 22    End of Session   Activity Tolerance: Patient tolerated treatment well Patient left: in bed;with call bell/phone within reach;with family/visitor present Nurse Communication: Mobility status PT Visit Diagnosis: Difficulty in walking, not elsewhere classified (R26.2)     Time: 1345-1401 PT Time Calculation (min) (ACUTE ONLY): 16 min  Charges:  $Gait Training: 8-22 mins                      Theodoro Grist [place] 04/25/2020, 2:09 PM

## 2020-04-25 NOTE — Progress Notes (Signed)
Notified [facility] Cherylin Mylar about pts CBG of 403. New orders to not give sliding scale dose of 15 Units but to give only 5 units novolog now and recheck CBG in about 3 hours. New orders implemented.

## 2020-04-25 NOTE — Progress Notes (Signed)
PROGRESS NOTE    Gerald Hughes  JQG:[id number] DOB: 1951-09-26 DOA: 04/22/2020 PCP: Pearson Forster, MD    Chief Complaint  Patient presents with  . Fever  . Weakness    Brief Narrative:  Patient 69 year old gentleman history of BPH, type 2 diabetes, hyperlipidemia, hypertension presented with diarrhea confusion lethargy and weakness.  COVID-19 PCR negative.  C. difficile PCR negative.  CT abdomen and pelvis with no acute findings to explain diarrhea fever, moderate burden of stool throughout the colon, nonobstructive left nephrolithiasis, coronary artery disease.  GI pathogen panel pending.  Patient placed empirically on IV ciprofloxacin and IV Flagyl for presumed infectious diarrhea.  Patient also placed on IV fluids for dehydration.   Assessment & Plan:   Principal Problem:   Acute infectious diarrhea Active Problems:   Acute metabolic encephalopathy   Uncontrolled type 2 diabetes mellitus with hyperglycemia, without [place]-term current use of insulin (HCC)   BPH (benign prostatic hyperplasia)   Mixed diabetic hyperlipidemia associated with type 2 diabetes mellitus (HCC)   Essential hypertension   GERD without esophagitis  1 acute presumed infectious diarrhea Patient had presented with weakness, frequent watery diarrhea, noted to have received several courses of antibiotics in the past several weeks raising concern for C. difficile colitis.  C. difficile PCR was negative.  GI pathogen panel pending.  Leukocytosis fluctuating.  CT abdomen and pelvis done negative for any acute abnormalities however did show moderate stool burden.  Concerned that patient may have watery stool seeping around moderate stool burden.  Patient improving clinically with IV fluids and empirically on IV ciprofloxacin and IV Flagyl.  Patient given some laxatives due to moderate stool burden and stated had multiple loose stools and would like to hold off on laxative or enema at this time.  Tolerating full liquid  diet.  Advance to a carb modified diet.  We will change IV antibiotics to oral ciprofloxacin and Flagyl.  Supportive care.   2.  Acute metabolic encephalopathy Patient had presented with a 1 day history of worsening lethargy and confusion felt likely secondary to volume depletion in the setting of probable infectious diarrhea.  Vitamin B12 levels elevated at 6547.  TSH at 0.214.  Folate within normal limits at 11.  Improving clinically.  Continue empiric antibiotics.  Supportive care.  3.  Acidosis Bicarb tablets.  4.  Type 2 diabetes mellitus with hyperglycemia Hemoglobin A1c 8.4.  CBG of 412 last night and currently at 217.  Increase Lantus to needs daily.  Place on meal coverage NovoLog 4 units 3 times a day.  Sliding scale insulin.  Continue to hold oral hypoglycemic agents.  Consult with diabetic coordinator.    5.  BPH Flomax.  6.  Hyperlipidemia Continue statin, Zetia.  7.  Abnormal TSH TSH noted at 0.214.  Will likely need repeat thyroid studies done in the outpatient setting post discharge.  8.  GERD PPI.   9.  Hypertension Continue lisinopril.  10.  Generalized weakness Likely secondary to problem #1.  Improving.  PT/OT.    DVT prophylaxis: SCDs Code Status: Full Family Communication: Updated patient, wife at bedside. Disposition:   Status is: Inpatient    Dispo: The patient is from: Home              Anticipated d/c is to: Home              Anticipated d/c date is: To be determined hopefully tomorrow.  Patient currently on IV antibiotics and being transitioned to oral antibiotics.  Awaiting GI pathogen panel.       Consultants:   None  Procedures:   CT head 04/23/2020  CT abdomen and pelvis 04/23/2020  Antimicrobials:   IV ciprofloxacin 04/23/2020>>>> oral ciprofloxacin 04/25/2020  IV Flagyl 04/23/2020>>>>> oral Flagyl 04/25/2020   Subjective: On bedside commode having bowel movement.  Stated had multiple loose stools after laxatives.   Denies chest pain or shortness of breath.  Strength improving.  Tolerating diet.  Noted to have elevated blood glucose levels last night and this morning.  Tolerated full liquids.  Objective: Vitals:   04/24/20 0947 04/24/20 1408 04/24/20 2103 04/25/20 0454  BP: 136/74 (!) 141/73 135/71 136/80  Pulse: 70 98 (!) 103 83  Resp: 18 18 16 20   Temp: 98.8 F (37.1 C) 98.6 F (37 C) 99.4 F (37.4 C) 97.6 F (36.4 C)  TempSrc: Oral Oral    SpO2: 98% 93% 95% 99%  Weight:      Height:        Intake/Output Summary (Last 24 hours) at 04/25/2020 0957 Last data filed at 04/25/2020 0558 Gross per 24 hour  Intake 1181.22 ml  Output 750 ml  Net 431.22 ml   Filed Weights   04/22/20 2312  Weight: 77.1 kg    Examination:  General exam: NAD Respiratory system: CTAB.  Cardiovascular system: Regular rate rhythm no murmurs rubs or gallops.  No JVD.  No lower extremity edema.  Gastrointestinal system: Abdomen is soft, nontender, nondistended, positive bowel sounds.  No rebound.  No guarding.  Central nervous system: Alert and oriented. No focal neurological deficits. Extremities: Symmetric 5 x 5 power. Skin: No rashes, lesions or ulcers Psychiatry: Judgement and insight appear normal. Mood & affect appropriate.     Data Reviewed: I have personally reviewed following labs and imaging studies  CBC: Recent Labs  Lab 04/23/20 0005 04/23/20 0600 04/23/20 1134 04/24/20 0350 04/25/20 0357  WBC 14.5* 13.6* 12.3* 11.5* 13.4*  NEUTROABS 11.2* 10.1*  --  8.1*  --   HGB 13.4 11.9* 11.4* 10.9* 12.1*  HCT 42.7 38.2* 36.5* 35.9* 37.9*  MCV 97.0 98.2 98.4 100.3* 95.5  PLT 414* 382 346 336 452*    Basic Metabolic Panel: Recent Labs  Lab 04/23/20 0005 04/24/20 0350 04/24/20 2229 04/25/20 0357  NA 140 139  --  143  K 3.9 3.9  --  3.4*  CL 107 113*  --  112*  CO2 18* 17*  --  21*  GLUCOSE 223* 129* 380* 202*  BUN 25* 16  --  14  CREATININE 1.25* 0.90  --  1.06  CALCIUM 10.0 8.5*  --  9.2    MG  --  2.0  --  2.1    GFR: Estimated Creatinine Clearance: 58.8 mL/min (by C-G formula based on SCr of 1.06 mg/dL).  Liver Function Tests: Recent Labs  Lab 04/23/20 0005 04/24/20 0350  AST 12* 13*  ALT 17 12  ALKPHOS 78 55  BILITOT 1.0 0.7  PROT 7.5 4.9*  ALBUMIN 2.8* 1.9*    CBG: Recent Labs  Lab 04/24/20 0739 04/24/20 1252 04/24/20 1645 04/24/20 2102 04/25/20 0741  GLUCAP 155* 361* 230* 412* 217*     Recent Results (from the past 240 hour(s))  C Difficile Quick Screen w PCR reflex     Status: None   Collection Time: 04/23/20 12:16 AM   Specimen: Stool  Result Value Ref Range Status   C Diff antigen  NEGATIVE NEGATIVE Final   C Diff toxin NEGATIVE NEGATIVE Final   C Diff interpretation No C. difficile detected.  Final    Comment: Performed at [facility], [address], [place], [place] [address]  SARS CORONAVIRUS 2 (TAT 6-24 HRS) Nasopharyngeal Nasopharyngeal Swab     Status: None   Collection Time: 04/23/20  2:04 AM   Specimen: Nasopharyngeal Swab  Result Value Ref Range Status   SARS Coronavirus 2 NEGATIVE NEGATIVE Final    Comment: (NOTE) SARS-CoV-2 target nucleic acids are NOT DETECTED. The SARS-CoV-2 RNA is generally detectable in upper and lower respiratory specimens during the acute phase of infection. Negative results do not preclude SARS-CoV-2 infection, do not rule out co-infections with other pathogens, and should not be used as the sole basis for treatment or other patient management decisions. Negative results must be combined with clinical observations, patient history, and epidemiological information. The expected result is Negative. Fact Sheet for Patients: HairSlick.no Fact Sheet for Healthcare Providers: quierodirigir.com This test is not yet approved or cleared by the [place] FDA and  has been authorized for detection and/or diagnosis of SARS-CoV-2  by FDA under an Emergency Use Authorization (EUA). This EUA will remain  in effect (meaning this test can be used) for the duration of the COVID-19 declaration under Section 56 4(b)(1) of the Act, 21 U.S.C. section 360bbb-3(b)(1), unless the authorization is terminated or revoked sooner. Performed at [facility], [address] [place], [place] [address]   Blood culture (routine x 2)     Status: None (Preliminary result)   Collection Time: 04/23/20  2:30 AM   Specimen: BLOOD RIGHT HAND  Result Value Ref Range Status   Specimen Description   Final    BLOOD RIGHT HAND Performed at [facility], [address] [place], [place] [address]    Special Requests   Final    BOTTLES DRAWN AEROBIC AND ANAEROBIC Blood Culture adequate volume Performed at [facility], [address], [place], [place] [address]    Culture   Final    NO GROWTH 2 DAYS Performed at [facility], [address], [place], [place] [address]    Report Status PENDING  Incomplete  Blood culture (routine x 2)     Status: None (Preliminary result)   Collection Time: 04/23/20  2:30 AM   Specimen: BLOOD  Result Value Ref Range Status   Specimen Description   Final    BLOOD RIGHT ARM Performed at [facility], [address], [place], [place] [address]    Special Requests   Final    BOTTLES DRAWN AEROBIC AND ANAEROBIC Blood Culture adequate volume Performed at [facility], [address], [place], [place] [address]    Culture   Final    NO GROWTH 2 DAYS Performed at [facility], [address], [place], [place] [address]    Report Status PENDING  Incomplete         Radiology Studies: CT ABDOMEN PELVIS W CONTRAST  Result Date: 04/23/2020 CLINICAL DATA:  Diarrhea, fever, confusion EXAM: CT ABDOMEN AND PELVIS WITH CONTRAST TECHNIQUE: Multidetector CT imaging of the abdomen and pelvis was performed using the standard  protocol following bolus administration of intravenous contrast. CONTRAST:  OMNIPAQUE IOHEXOL 300 MG/ML  SOLN COMPARISON:  None. FINDINGS: Lower chest: No acute abnormality. Three-vessel coronary artery calcifications. Hepatobiliary: No solid liver abnormality is seen. No gallstones, gallbladder wall thickening,  or biliary dilatation. Pancreas: Unremarkable. No pancreatic ductal dilatation or surrounding inflammatory changes. Spleen: Normal in size without significant abnormality. Adrenals/Urinary Tract: Adrenal glands are unremarkable. Multiple nonobstructive left renal calculi. No hydronephrosis. Bladder is unremarkable. Stomach/Bowel: Stomach is within normal limits. Appendix appears normal. No evidence of bowel wall thickening, distention, or inflammatory changes. Moderate burden of stool throughout the colon. Vascular/Lymphatic: Aortic atherosclerosis. No enlarged abdominal or pelvic lymph nodes. Reproductive: No mass or other significant abnormality. Other: Status post right inguinal hernia repair. No abdominopelvic ascites. Musculoskeletal: No acute or significant osseous findings. IMPRESSION: 1. No acute CT findings of the abdomen or pelvis to explain diarrhea or fever. Moderate burden of stool throughout the colon. 2.  Nonobstructive left nephrolithiasis. 3.  Coronary artery disease.  Aortic Atherosclerosis (ICD10-I70.0). Electronically Signed   By: Lauralyn Primes M.D.   On: 04/23/2020 14:19        Scheduled Meds: . atorvastatin  40 mg Oral Daily  . bisacodyl  10 mg Rectal Once  . ezetimibe  10 mg Oral Daily  . feeding supplement  1 Container Oral TID BM  . feeding supplement (PRO-STAT SUGAR FREE 64)  30 mL Oral Daily  . insulin aspart  0-15 Units Subcutaneous TID AC & HS  . insulin aspart  4 Units Subcutaneous TID WC  . insulin glargine  12 Units Subcutaneous Daily  . lisinopril  20 mg Oral Daily  . multivitamin with minerals  1 tablet Oral Daily  . pantoprazole (PROTONIX) IV  40 mg  Intravenous Daily  . sodium bicarbonate  650 mg Oral BID  . tamsulosin  0.8 mg Oral QPC supper   Continuous Infusions: . ciprofloxacin 400 mg (04/25/20 0810)  . metronidazole 500 mg (04/25/20 0557)     LOS: 1 day    Time spent: 35 minutes    Ramiro Harvest, MD [facility]   To contact the attending provider between 7A-7P or the covering provider during after hours 7P-7A, please log into the web site [email or web address] and access using universal [place] password for that web site. If you do not have the password, please call the hospital operator.  04/25/2020, 9:57 AM

## 2020-04-25 NOTE — Progress Notes (Signed)
Inpatient Diabetes Program Recommendations  AACE/ADA: New Consensus Statement on Inpatient Glycemic Control (2015)  Target Ranges:  Prepandial:   less than 140 mg/dL      Peak postprandial:   less than 180 mg/dL (1-2 hours)      Critically ill patients:  140 - 180 mg/dL   Lab Results  Component Value Date   GLUCAP 315 (H) 04/25/2020   HGBA1C 8.4 (H) 04/23/2020    Review of Glycemic Control  Diabetes history: DM2 Outpatient Diabetes medications: Jardiance 25 mg QD, metformin 1000 mg bid, previously on Janumet 50/1000 mg bid Current orders for Inpatient glycemic control: Lantus 12 units QD, Novolog 0-15 units tidwc and hs + 4 units tidwc  HgbA1C - 8.4%  Spoke to pt and wife regarding HgbA1c of 8.4%. Pt states his previous HgbA1C was 6-7%, but had to stop Janumet since it was $1500/90 days. Continues to take metformin and Jardiance. Blood sugars have gone up since leaving off Januvia. Checks blood sugars twice/day. Appears to be motivated to control his blood sugars. Had appt with PCP today, and wife states she will reschedule later in week.  Inpatient Diabetes Program Recommendations:     Jardiance 25 mg QD Metformin - increase to 1500 mg bid Discuss trying Tradjenta 5 mg QD with PCP, as it may be less expensive than Januvia.   Pt and wife appreciative of information and visit.  Thank you. Ailene Ards, RD, LDN, CDE Inpatient Diabetes Coordinator [phone]

## 2020-04-26 DIAGNOSIS — R531 Weakness: Secondary | ICD-10-CM | POA: Diagnosis not present

## 2020-04-26 DIAGNOSIS — N400 Enlarged prostate without lower urinary tract symptoms: Secondary | ICD-10-CM | POA: Diagnosis not present

## 2020-04-26 DIAGNOSIS — G9341 Metabolic encephalopathy: Secondary | ICD-10-CM | POA: Diagnosis not present

## 2020-04-26 DIAGNOSIS — A09 Infectious gastroenteritis and colitis, unspecified: Secondary | ICD-10-CM | POA: Diagnosis not present

## 2020-04-26 LAB — GLUCOSE, CAPILLARY
Glucose-Capillary: 294 mg/dL — ABNORMAL HIGH (ref 70–99)
Glucose-Capillary: 231 mg/dL — ABNORMAL HIGH (ref 70–99)
Glucose-Capillary: 302 mg/dL — ABNORMAL HIGH (ref 70–99)
Glucose-Capillary: 226 mg/dL — ABNORMAL HIGH (ref 70–99)
Glucose-Capillary: 234 mg/dL — ABNORMAL HIGH (ref 70–99)

## 2020-04-26 LAB — GI PATHOGEN PANEL BY PCR, STOOL

## 2020-04-26 LAB — LYME DISEASE, WESTERN BLOT
IgG P18 Ab.: ABSENT
IgG P23 Ab.: ABSENT
IgG P28 Ab.: ABSENT
IgG P30 Ab.: ABSENT
IgG P39 Ab.: ABSENT
IgG P45 Ab.: ABSENT
IgG P58 Ab.: ABSENT
IgG P66 Ab.: ABSENT
IgG P93 Ab.: ABSENT
IgM P23 Ab.: ABSENT
IgM P39 Ab.: ABSENT
IgM P41 Ab.: ABSENT
Lyme IgG Wb: NEGATIVE
Lyme IgM Wb: NEGATIVE

## 2020-04-26 LAB — ROCKY MTN SPOTTED FVR ABS PNL(IGG+IGM)
RMSF IgG: NEGATIVE
RMSF IgM: 3.66 index — ABNORMAL HIGH (ref 0.00–0.89)

## 2020-04-26 LAB — BASIC METABOLIC PANEL
Anion gap: 10 (ref 5–15)
BUN: 18 mg/dL (ref 8–23)
CO2: 24 mmol/L (ref 22–32)
Calcium: 9.1 mg/dL (ref 8.9–10.3)
Chloride: 108 mmol/L (ref 98–111)
Creatinine, Ser: 1.26 mg/dL — ABNORMAL HIGH (ref 0.61–1.24)
GFR calc Af Amer: >60 mL/min (ref >60)
GFR calc non Af Amer: 57 mL/min — ABNORMAL LOW (ref >60)
Glucose, Bld: 322 mg/dL — ABNORMAL HIGH (ref 70–99)
Potassium: 3.7 mmol/L (ref 3.5–5.1)
Sodium: 142 mmol/L (ref 135–145)

## 2020-04-26 LAB — CBC WITH DIFFERENTIAL/PLATELET
Abs Immature Granulocytes: 0.61 10*3/uL — ABNORMAL HIGH (ref 0.00–0.07)
Basophils Absolute: 0.1 10*3/uL (ref 0.0–0.1)
Basophils Relative: 1 %
Eosinophils Absolute: 0.6 10*3/uL — ABNORMAL HIGH (ref 0.0–0.5)
Eosinophils Relative: 5 %
HCT: 38.4 % — ABNORMAL LOW (ref 39.0–52.0)
Hemoglobin: 12 g/dL — ABNORMAL LOW (ref 13.0–17.0)
Immature Granulocytes: 5 %
Lymphocytes Relative: 9 %
Lymphs Abs: 1.1 10*3/uL (ref 0.7–4.0)
MCH: 29.9 pg (ref 26.0–34.0)
MCHC: 31.3 g/dL (ref 30.0–36.0)
MCV: 95.8 fL (ref 80.0–100.0)
Monocytes Absolute: 1.5 10*3/uL — ABNORMAL HIGH (ref 0.1–1.0)
Monocytes Relative: 12 %
Neutro Abs: 8.4 10*3/uL — ABNORMAL HIGH (ref 1.7–7.7)
Neutrophils Relative %: 68 %
Platelets: 383 10*3/uL (ref 150–400)
RBC: 4.01 MIL/uL — ABNORMAL LOW (ref 4.22–5.81)
RDW: 14 % (ref 11.5–15.5)
WBC: 12.3 10*3/uL — ABNORMAL HIGH (ref 4.0–10.5)
nRBC: 0 % (ref 0.0–0.2)

## 2020-04-26 MED ORDER — PANTOPRAZOLE SODIUM 40 MG PO TBEC
40.0000 mg | DELAYED_RELEASE_TABLET | Freq: Every day | ORAL | Status: DC
  Administered 2020-04-27: 40 mg via ORAL
  Filled 2020-04-26: qty 1

## 2020-04-26 MED ORDER — SENNOSIDES-DOCUSATE SODIUM 8.6-50 MG PO TABS
1.0000 | ORAL_TABLET | Freq: Two times a day (BID) | ORAL | Status: DC
  Administered 2020-04-26 – 2020-04-27 (×2): 1 via ORAL
  Filled 2020-04-26 (×2): qty 1

## 2020-04-26 MED ORDER — INSULIN GLARGINE 100 UNIT/ML ~~LOC~~ SOLN
16.0000 [IU] | Freq: Every day | SUBCUTANEOUS | Status: DC
  Administered 2020-04-26 – 2020-04-27 (×2): 16 [IU] via SUBCUTANEOUS
  Filled 2020-04-26 (×2): qty 0.16

## 2020-04-26 MED ORDER — DOXYCYCLINE HYCLATE 100 MG PO TABS
100.0000 mg | ORAL_TABLET | Freq: Two times a day (BID) | ORAL | Status: DC
  Administered 2020-04-26 – 2020-04-27 (×3): 100 mg via ORAL
  Filled 2020-04-26 (×3): qty 1

## 2020-04-26 NOTE — Progress Notes (Signed)
PROGRESS NOTE    Gerald Hughes  WUJ:[id number] DOB: 1951/07/22 DOA: 04/22/2020 PCP: Ivan Anchors, MD    Chief Complaint  Patient presents with   Fever   Weakness    Brief Narrative:  Patient 69 year old gentleman history of BPH, type 2 diabetes, hyperlipidemia, hypertension presented with diarrhea confusion lethargy and weakness.  COVID-19 PCR negative.  C. difficile PCR negative.  CT abdomen and pelvis with no acute findings to explain diarrhea fever, moderate burden of stool throughout the colon, nonobstructive left nephrolithiasis, coronary artery disease.  GI pathogen panel pending.  Patient placed empirically on IV ciprofloxacin and IV Flagyl for presumed infectious diarrhea.  Patient also placed on IV fluids for dehydration.   Assessment & Plan:   Principal Problem:   Acute infectious diarrhea Active Problems:   Acute metabolic encephalopathy   Uncontrolled type 2 diabetes mellitus with hyperglycemia, without Lamping-term current use of insulin (HCC)   BPH (benign prostatic hyperplasia)   Mixed diabetic hyperlipidemia associated with type 2 diabetes mellitus ([place])   Essential hypertension   GERD without esophagitis  1 acute presumed infectious diarrhea/positive RMSF test Patient had presented with weakness, frequent watery diarrhea, noted to have received several courses of antibiotics in the past several weeks raising concern for C. difficile colitis.  C. difficile PCR was negative.  GI pathogen panel negative.  Leukocytosis fluctuating.  CT abdomen and pelvis done negative for any acute abnormalities however did show moderate stool burden.  Concerned that patient may have watery stool seeping around moderate stool burden.  Patient was initially improving clinically on IV fluids and IV ciprofloxacin and IV Flagyl.  Patient given some laxatives due to moderate stool burden and stated had multiple loose stools and would like to hold off on laxative or enema at this time.  Diet  advanced to a carb modified diet.  IV ciprofloxacin and IV Flagyl changed to oral ciprofloxacin and oral Flagyl.  [facility] spotted fever IgM test positive at 3.66.  Patient now with some complaints of headache, subjective fevers and chills.  Discontinue ciprofloxacin and Flagyl.  Will place patient on doxycycline 100 mg twice daily x7 days.  Will need close outpatient follow-up with PCP.  Discussed with ID, Dr. Johnnye Sima.   2.  Acute metabolic encephalopathy Patient had presented with a 1 day history of worsening lethargy and confusion felt likely secondary to volume depletion in the setting of probable infectious diarrhea.  Vitamin B12 levels elevated at 6547.  TSH at 0.214.  Folate within normal limits at 11.  Improving clinically.  Continue empiric antibiotics.  Supportive care.  3.  Acidosis Bicarb tablets.  4.  Type 2 diabetes mellitus with hyperglycemia Hemoglobin A1c 8.4.  CBG of 231 this morning.  CBG as high as 403 last night.  Increase Lantus to 16 units daily, continue meal coverage NovoLog 4 units 3 times a day, sliding scale insulin.  Continue to hold oral hypoglycemic agents.  Will need close outpatient follow-up with PCP.   5.  BPH Flomax.  6.  Hyperlipidemia Continue Zetia, statin.  7.  Abnormal TSH TSH noted at 0.214.  Will likely need repeat thyroid studies done in the outpatient setting post discharge.  8.  GERD PPI.   9.  Hypertension Lisinopril.   10.  Generalized weakness Likely secondary to problem #1.  Improving.  PT/OT.    DVT prophylaxis: SCDs Code Status: Full Family Communication: Updated patient, wife, daughter at bedside. Disposition:   Status is: Inpatient    Dispo:  The patient is from: Home              Anticipated d/c is to: Home              Anticipated d/c date is: To be determined hopefully tomorrow.              Patient currently not feeling too well today with some headaches, subjective fevers, chills, not feeling well.  Antibiotics  have been changed to doxycycline as RMSF titers positive.        Consultants:   Curb sided ID: Dr. Ninetta Lights 04/26/2020  Procedures:   CT head 04/23/2020  CT abdomen and pelvis 04/23/2020  Antimicrobials:   IV ciprofloxacin 04/23/2020>>>> oral ciprofloxacin 04/25/2020>>>> 04/26/2020  IV Flagyl 04/23/2020>>>>> oral Flagyl 04/25/2020>>>> 04/26/2020  Doxycycline 04/26/2020   Subjective: Patient laying in bed.  Wife at bedside.  Patient denies any nausea or emesis.  States does not feel too well today.  Had some loose stools yesterday.  No chest pain.  Denies any abdominal pain.  Very hesitant for any laxatives.  Tolerating current diet.   This afternoon patient with complaints of headache, some fevers, some chills, just does not feel too well this afternoon.    Objective: Vitals:   04/25/20 1348 04/25/20 2112 04/25/20 2128 04/26/20 0504  BP: 125/82  (!) 115/54 125/69  Pulse: 91 88 (!) 108 82  Resp: 17 16 17 16   Temp: 98.3 F (36.8 C)  98.8 F (37.1 C) 98.6 F (37 C)  TempSrc: Oral  Oral Oral  SpO2: 99% 97% 94% 98%  Weight:      Height:        Intake/Output Summary (Last 24 hours) at 04/26/2020 1143 Last data filed at 04/26/2020 1018 Gross per 24 hour  Intake 1340 ml  Output --  Net 1340 ml   Filed Weights   04/22/20 2312  Weight: 77.1 kg    Examination:  General exam: NAD Respiratory system: Lungs clear to auscultation bilaterally.  No wheezes, no crackles, no rhonchi  Cardiovascular system: Regular rate rhythm no murmurs rubs or gallops.  No JVD.  No lower extremity edema.  Gastrointestinal system: Abdomen is nontender, nondistended, soft, positive bowel sounds.  No rebound.  No guarding.  Central nervous system: Alert and oriented. No focal neurological deficits. Extremities: Symmetric 5 x 5 power. Skin: No rashes, lesions or ulcers Psychiatry: Judgement and insight appear normal. Mood & affect appropriate.     Data Reviewed: I have personally reviewed following labs and  imaging studies  CBC: Recent Labs  Lab 04/23/20 0005 04/23/20 0005 04/23/20 0600 04/23/20 1134 04/24/20 0350 04/25/20 0357 04/26/20 0232  WBC 14.5*   < > 13.6* 12.3* 11.5* 13.4* 12.3*  NEUTROABS 11.2*  --  10.1*  --  8.1*  --  8.4*  HGB 13.4   < > 11.9* 11.4* 10.9* 12.1* 12.0*  HCT 42.7   < > 38.2* 36.5* 35.9* 37.9* 38.4*  MCV 97.0   < > 98.2 98.4 100.3* 95.5 95.8  PLT 414*   < > 382 346 336 452* 383   < > = values in this interval not displayed.    Basic Metabolic Panel: Recent Labs  Lab 04/23/20 0005 04/23/20 0005 04/24/20 0350 04/24/20 2229 04/25/20 0357 04/25/20 2311 04/26/20 0232  NA 140  --  139  --  143  --  142  K 3.9  --  3.9  --  3.4*  --  3.7  CL 107  --  113*  --  112*  --  108  CO2 18*  --  17*  --  21*  --  24  GLUCOSE 223*   < > 129* 380* 202* 443* 322*  BUN 25*  --  16  --  14  --  18  CREATININE 1.25*  --  0.90  --  1.06  --  1.26*  CALCIUM 10.0  --  8.5*  --  9.2  --  9.1  MG  --   --  2.0  --  2.1  --   --    < > = values in this interval not displayed.    GFR: Estimated Creatinine Clearance: 49.4 mL/min (A) (by C-G formula based on SCr of 1.26 mg/dL (H)).  Liver Function Tests: Recent Labs  Lab 04/23/20 0005 04/24/20 0350  AST 12* 13*  ALT 17 12  ALKPHOS 78 55  BILITOT 1.0 0.7  PROT 7.5 4.9*  ALBUMIN 2.8* 1.9*    CBG: Recent Labs  Lab 04/25/20 1706 04/25/20 2131 04/26/20 0224 04/26/20 0721 04/26/20 1140  GLUCAP 234* 403* 294* 231* 302*     Recent Results (from the past 240 hour(s))  GI pathogen panel by PCR, stool     Status: None   Collection Time: 04/23/20 12:15 AM  Result Value Ref Range Status   Plesiomonas shigelloides NOT DETECTED NOT DETECTED Final   Yersinia enterocolitica NOT DETECTED NOT DETECTED Final   Vibrio NOT DETECTED NOT DETECTED Final   Enteropathogenic E coli NOT DETECTED NOT DETECTED Final   E coli (ETEC) LT/ST NOT DETECTED NOT DETECTED Final   E coli 0157 by PCR Not applicable NOT DETECTED Final     Cryptosporidium by PCR NOT DETECTED NOT DETECTED Final   Entamoeba histolytica NOT DETECTED NOT DETECTED Final   Adenovirus F 40/41 NOT DETECTED NOT DETECTED Final   Norovirus GI/GII NOT DETECTED NOT DETECTED Final   Sapovirus NOT DETECTED NOT DETECTED Final    Comment: (NOTE) Performed At: [facility] [address] [place], [place] [id number] Jolene Schimke MD VH:[id number]    Vibrio cholerae NOT DETECTED NOT DETECTED Final   Campylobacter by PCR NOT DETECTED NOT DETECTED Final   Salmonella by PCR NOT DETECTED NOT DETECTED Final   E coli (STEC) NOT DETECTED NOT DETECTED Final   Enteroaggregative E coli NOT DETECTED NOT DETECTED Final   Shigella by PCR NOT DETECTED NOT DETECTED Final   Cyclospora cayetanensis NOT DETECTED NOT DETECTED Final   Astrovirus NOT DETECTED NOT DETECTED Final   G lamblia by PCR NOT DETECTED NOT DETECTED Final   Rotavirus A by PCR NOT DETECTED NOT DETECTED Final  C Difficile Quick Screen w PCR reflex     Status: None   Collection Time: 04/23/20 12:16 AM   Specimen: Stool  Result Value Ref Range Status   C Diff antigen NEGATIVE NEGATIVE Final   C Diff toxin NEGATIVE NEGATIVE Final   C Diff interpretation No C. difficile detected.  Final    Comment: Performed at [facility], [address] [place], [place] [address]  SARS CORONAVIRUS 2 (TAT 6-24 HRS) Nasopharyngeal Nasopharyngeal Swab     Status: None   Collection Time: 04/23/20  2:04 AM   Specimen: Nasopharyngeal Swab  Result Value Ref Range Status   SARS Coronavirus 2 NEGATIVE NEGATIVE Final    Comment: (NOTE) SARS-CoV-2 target nucleic acids are NOT DETECTED. The SARS-CoV-2 RNA is generally detectable in upper and lower respiratory specimens during the acute  phase of infection. Negative results do not preclude SARS-CoV-2 infection, do not rule out co-infections with other pathogens, and should not be used as the sole basis for treatment or other patient management  decisions. Negative results must be combined with clinical observations, patient history, and epidemiological information. The expected result is Negative. Fact Sheet for Patients: HairSlick.no Fact Sheet for Healthcare Providers: quierodirigir.com This test is not yet approved or cleared by the [place] FDA and  has been authorized for detection and/or diagnosis of SARS-CoV-2 by FDA under an Emergency Use Authorization (EUA). This EUA will remain  in effect (meaning this test can be used) for the duration of the COVID-19 declaration under Section 56 4(b)(1) of the Act, 21 U.S.C. section 360bbb-3(b)(1), unless the authorization is terminated or revoked sooner. Performed at [facility], [address] [place], [place] [address]   Blood culture (routine x 2)     Status: None (Preliminary result)   Collection Time: 04/23/20  2:30 AM   Specimen: BLOOD RIGHT HAND  Result Value Ref Range Status   Specimen Description   Final    BLOOD RIGHT HAND Performed at [facility], [address], [place], [place] [address]    Special Requests   Final    BOTTLES DRAWN AEROBIC AND ANAEROBIC Blood Culture adequate volume Performed at [facility], [address] [place], [place] [address]    Culture   Final    NO GROWTH 3 DAYS Performed at [facility], [address], [place], [place] [address]    Report Status PENDING  Incomplete  Blood culture (routine x 2)     Status: None (Preliminary result)   Collection Time: 04/23/20  2:30 AM   Specimen: BLOOD  Result Value Ref Range Status   Specimen Description   Final    BLOOD RIGHT ARM Performed at [facility], [address] [place], [place] [address]    Special Requests   Final    BOTTLES DRAWN AEROBIC AND ANAEROBIC Blood Culture adequate volume Performed at [facility], [address],  [place], [place] [address]    Culture   Final    NO GROWTH 3 DAYS Performed at [facility], [address], [place], [place] [address]    Report Status PENDING  Incomplete         Radiology Studies: No results found.      Scheduled Meds:  atorvastatin  40 mg Oral Daily   bisacodyl  10 mg Rectal Once   doxycycline  100 mg Oral Q12H   ezetimibe  10 mg Oral Daily   feeding supplement  1 Container Oral TID BM   feeding supplement (PRO-STAT SUGAR FREE 64)  30 mL Oral Daily   insulin aspart  0-15 Units Subcutaneous TID AC & HS   insulin aspart  4 Units Subcutaneous TID WC   insulin glargine  16 Units Subcutaneous Daily   lisinopril  20 mg Oral Daily   multivitamin with minerals  1 tablet Oral Daily   [START ON 04/27/2020] pantoprazole  40 mg Oral Daily   tamsulosin  0.8 mg Oral QPC supper   Continuous Infusions:    LOS: 2 days    Time spent: 35 minutes    Ramiro Harvest, MD [facility]   To contact the attending provider between 7A-7P or the covering provider during after hours 7P-7A, please log into the web site [email or web address] and access using universal  [place] password for that web site. If you do not have the password, please call the hospital operator.  04/26/2020, 11:43 AM

## 2020-04-26 NOTE — Progress Notes (Signed)
PHARMACIST - PHYSICIAN COMMUNICATION  DR:   Janee Morn  CONCERNING: IV to Oral Route Change Policy  RECOMMENDATION: This patient is receiving protonix by the intravenous route.  Based on criteria approved by the Pharmacy and Therapeutics Committee, the intravenous medication(s) is/are being converted to the equivalent oral dose form(s) starting 04/27/20.   DESCRIPTION: These criteria include:  The patient is eating (either orally or via tube) and/or has been taking other orally administered medications for a least 24 hours  The patient has no evidence of active gastrointestinal bleeding or impaired GI absorption (gastrectomy, short bowel, patient on TNA or NPO).  If you have questions about this conversion, please contact the Pharmacy Department  []   [phone] )  ( [phone] []   [phone] )  [facility] []   [phone] )  [facility] []   [phone] )  [facility] [x]   [phone] )  [facility]   ( [phone], PharmD, BCPS Pharmacy: [phone] 04/26/2020 10:53 AM

## 2020-04-27 DIAGNOSIS — G9341 Metabolic encephalopathy: Secondary | ICD-10-CM | POA: Diagnosis not present

## 2020-04-27 DIAGNOSIS — A09 Infectious gastroenteritis and colitis, unspecified: Secondary | ICD-10-CM | POA: Diagnosis not present

## 2020-04-27 DIAGNOSIS — N400 Enlarged prostate without lower urinary tract symptoms: Secondary | ICD-10-CM | POA: Diagnosis not present

## 2020-04-27 DIAGNOSIS — I10 Essential (primary) hypertension: Secondary | ICD-10-CM | POA: Diagnosis not present

## 2020-04-27 LAB — BASIC METABOLIC PANEL
Anion gap: 10 (ref 5–15)
BUN: 15 mg/dL (ref 8–23)
CO2: 25 mmol/L (ref 22–32)
Calcium: 8.3 mg/dL — ABNORMAL LOW (ref 8.9–10.3)
Chloride: 103 mmol/L (ref 98–111)
Creatinine, Ser: 1.11 mg/dL (ref 0.61–1.24)
GFR calc Af Amer: >60 mL/min (ref >60)
GFR calc non Af Amer: >60 mL/min (ref >60)
Glucose, Bld: 267 mg/dL — ABNORMAL HIGH (ref 70–99)
Potassium: 3.5 mmol/L (ref 3.5–5.1)
Sodium: 138 mmol/L (ref 135–145)

## 2020-04-27 LAB — GLUCOSE, CAPILLARY
Glucose-Capillary: 216 mg/dL — ABNORMAL HIGH (ref 70–99)
Glucose-Capillary: 292 mg/dL — ABNORMAL HIGH (ref 70–99)

## 2020-04-27 MED ORDER — DOXYCYCLINE HYCLATE 100 MG PO TABS: 100.0000 mg | ORAL_TABLET | Freq: Two times a day (BID) | ORAL | 0 refills | Status: AC

## 2020-04-27 MED ORDER — SENNOSIDES-DOCUSATE SODIUM 8.6-50 MG PO TABS: 1.0000 | ORAL_TABLET | Freq: Every day | ORAL | 0 refills | Status: AC

## 2020-04-27 NOTE — Care Management Important Message (Signed)
Important Message  Patient Details IM Letter given to Amada Jupiter SW Case Manager to present to the Patient Name: Gerald Hughes MRN: [id number] Date of Birth: 09/16/1951   Medicare Important Message Given:  Yes     Caren Macadam 04/27/2020, 11:58 AM

## 2020-04-27 NOTE — Plan of Care (Signed)
Pt ready for DC home 

## 2020-04-27 NOTE — Discharge Summary (Signed)
Physician Discharge Summary  CRESPIN FORSTROM ZOX:[id number] DOB: Apr 06, 1951 DOA: 04/22/2020  PCP: Pearson Forster, MD  Admit date: 04/22/2020 Discharge date: 04/27/2020  Admitted From: Home  Disposition:  Home   Recommendations for Outpatient Follow-up and new medication changes:  1. Follow up with Dr. Tresa Endo in 7 days.  2. Continue Doxycycline for 6 more days. 3. Take colace daily for 3 days then only as needed.   Home Health: no   Equipment/Devices: no    Discharge Condition: stable  CODE STATUS: full  Diet recommendation: heart healthy and diabetic prudent.   Brief/Interim Summary: Patient admitted to the hospital with a working diagnosis of acute infectious diarrhea complicated with metabolic encephalopathy and anion gap metabolic acidosis  69 year old male who presented with fever and generalized weakness.  He does have significant past medical history for type II diabetes mellitus, dyslipidemia, hypertension and benign prostatic hyperplasia.  Approximately 2 days after patient receiving his second dose of Covid vaccination 04/14, patient developed sore throat, fevers and profuse diarrhea.  10 days after symptom onset he was seen at [facility] ED, discharged home with a course of Ceftin.  At home his symptoms continue to persist and worsen, patient confused and lethargic.  On his initial physical examination his heart rate was 108/76, heart rate 119, respiratory rate 17, oxygen saturation 95%, lungs clear to auscultation bilaterally, heart S1-S2 present rhythmic, abdomen soft nontender, no lower extremity edema.  Patient was lethargic but easy to arouse. Sodium 140, potassium 3.9, chloride 107, bicarb 18, glucose 223, BUN 25, creatinine 1.25, anion gap 15, white count 14.5, hemoglobin 13.4, hematocrit 42.7, platelets 414.  SARS COVID-19 negative.  Urinalysis specific gravity 1.024, negative for infection.  Stool negative for C. difficile, negative GI panel.  Chest radiograph no infiltrates.   EKG 86 bpm, normal axis, normal intervals, sinus rhythm, no ST segment or T wave changes. CT of the abdomen with no acute findings.    Head CT no acute changes.  Patient was placed on antibiotic therapy and intravenous fluids.  [facility] spotted fever test came back positive and antibiotic therapy was transitioned to doxycycline.  His mentation has been improving.  Physical therapy evaluation, no follow-up recommended.  1.  Acute infectious diarrhea, positive [place] spotted fever test.  Patient was admitted to the medical ward, he received supportive medical therapy with intravenous fluids and antibiotic therapy with ciprofloxacin and metronidazole.  Further stool work-up with GI panel, and C. difficile were negative.  Patient symptoms improved, IgM [place] spotted fever +3.66.  Discussed with ID, recommendations for a 7 day course of doxycycline.  Patient at discharge developed constipation, improved with bowel regimen.   2.  Acute metabolic encephalopathy.  Patient received supportive medical therapy with improvement of his symptoms.  At discharge patient is back to baseline.  3.  Acute kidney injury with anion gap metabolic acidosis.  Patient received IV fluids with improvement of kidney function and electrolytes.  At discharge bicarb 25, creatinine 1.1, BUN 15, anion gap 10.  4.  Uncontrolled type 2 diabetes mellitus, hemoglobin A1c 8.4/ dyslipidemia.  Patient received insulin therapy while hospitalized, sliding scale and basal.  At discharge resume Metformin and GLT 2 inhibitors.  Continue statin and ezetimibe.  5.  GERD.  Continue antiacid therapy with proton pump inhibitors.  6.  Hypertension.  Patient will resume lisinopril.  Discharge Diagnoses:  Principal Problem:   Acute infectious diarrhea Active Problems:   Acute metabolic encephalopathy   Uncontrolled type 2 diabetes  mellitus with hyperglycemia, without Rios-term current use of insulin (HCC)   BPH (benign  prostatic hyperplasia)   Mixed diabetic hyperlipidemia associated with type 2 diabetes mellitus ([place])   Essential hypertension   GERD without esophagitis    Discharge Instructions   Allergies as of 04/27/2020      Reactions   Penicillins Anaphylaxis   Other reaction(s): Other (See Comments) "put me in ICU for 5 days" "put me in ICU for 5 days"   Cortisone Rash   Lidocaine Rash   Mercurial Derivatives Rash   Neomycin-polymyxin B Gu    Other reaction(s): Other OTHER   Other Rash   Scopolamine Rash   Sulfa Antibiotics Rash   Thimerosal Rash   Hydrochlorothiazide Photosensitivity   Neomycin Rash   Procaine    Other reaction(s): Unknown Was told from a allergy test      Medication List    STOP taking these medications   cefUROXime 500 MG tablet Commonly known as: CEFTIN     TAKE these medications   acetaminophen 325 MG tablet Commonly known as: TYLENOL Take 650 mg by mouth every 6 (six) hours as needed for moderate pain.   atorvastatin 40 MG tablet Commonly known as: LIPITOR Take 40 mg by mouth daily.   cetirizine 10 MG tablet Commonly known as: ZYRTEC Take 10 mg by mouth See admin instructions. 1 tablet daily for 2 weeks and then alternate to fexofenadine   cholecalciferol 25 MCG (1000 UNIT) tablet Commonly known as: VITAMIN D3 Take 1,000 Units by mouth in the morning and at bedtime.   desoximetasone 0.05 % cream Commonly known as: TOPICORT Apply 1 application topically 2 (two) times daily as needed (Rash).   doxycycline 100 MG tablet Commonly known as: VIBRA-TABS Take 1 tablet (100 mg total) by mouth every 12 (twelve) hours for 6 days.   ezetimibe 10 MG tablet Commonly known as: ZETIA Take 10 mg by mouth daily.   fexofenadine 180 MG tablet Commonly known as: ALLEGRA Take 180 mg by mouth See admin instructions. 1 tablet every evening for 2 weeks and alternate to cetirizine   Glucosamine 500 MG Caps Take 1,000 mg by mouth in the morning and at  bedtime.   Jardiance 25 MG Tabs tablet Generic drug: empagliflozin Take 25 mg by mouth daily.   lisinopril 20 MG tablet Commonly known as: ZESTRIL Take 20 mg by mouth daily.   metFORMIN 500 MG 24 hr tablet Commonly known as: GLUCOPHAGE-XR Take 1,000 mg by mouth in the morning and at bedtime.   senna-docusate 8.6-50 MG tablet Commonly known as: Senokot-S Take 1 tablet by mouth daily. Take daily for 3 days, then only as needed for constipation.   sodium chloride 0.65 % Soln nasal spray Commonly known as: OCEAN Place 1 spray into both nostrils as needed for congestion.   tacrolimus 0.1 % ointment Commonly known as: PROTOPIC Apply 1 application topically 2 (two) times daily as needed (itching).   tamsulosin 0.4 MG Caps capsule Commonly known as: FLOMAX Take 0.8 mg by mouth daily after supper.   vitamin B-12 1000 MCG tablet Commonly known as: CYANOCOBALAMIN Take 1,000 mcg by mouth in the morning and at bedtime.   vitamin C 250 MG tablet Commonly known as: ASCORBIC ACID Take 250 mg by mouth daily.      Follow-up Information    Ivan Anchors, MD Follow up in 1 week(s).   Specialty: Family Medicine Contact information: [place] [address] [phone]  Allergies  Allergen Reactions  . Penicillins Anaphylaxis    Other reaction(s): Other (See Comments) "put me in ICU for 5 days" "put me in ICU for 5 days"   . Cortisone Rash  . Lidocaine Rash  . Mercurial Derivatives Rash  . Neomycin-Polymyxin B Gu     Other reaction(s): Other OTHER  . Other Rash  . Scopolamine Rash  . Sulfa Antibiotics Rash  . Thimerosal Rash  . Hydrochlorothiazide Photosensitivity  . Neomycin Rash  . Procaine     Other reaction(s): Unknown Was told from a allergy test        Procedures/Studies: CT Head Wo Contrast  Result Date: 04/23/2020 CLINICAL DATA:  Altered mental status EXAM: CT HEAD WITHOUT CONTRAST TECHNIQUE: Contiguous axial images were  obtained from the base of the skull through the vertex without intravenous contrast. COMPARISON:  None. FINDINGS: Brain: No evidence of acute territorial infarction, hemorrhage, hydrocephalus,extra-axial collection or mass lesion/mass effect. There is dilatation the ventricles and sulci consistent with age-related atrophy. Low-attenuation changes in the deep white matter consistent with small vessel ischemia. 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 Vascular: No hyperdense vessel or unexpected calcification. Skull: The skull is intact. No fracture or focal lesion identified. Sinuses/Orbits: The visualized paranasal sinuses and mastoid air cells are clear. The orbits and globes intact. Other: None IMPRESSION: No acute intracranial abnormality. 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 Electronically Signed   By: Jonna ClarkBindu  Avutu M.D.   On: 04/23/2020 00:44   CT ABDOMEN PELVIS W CONTRAST  Result Date: 04/23/2020 CLINICAL DATA:  Diarrhea, fever, confusion EXAM: CT ABDOMEN AND PELVIS WITH CONTRAST TECHNIQUE: Multidetector CT imaging of the abdomen and pelvis was performed using the standard protocol following bolus administration of intravenous contrast. CONTRAST:  100mL OMNIPAQUE IOHEXOL 300 MG/ML  SOLN COMPARISON:  None. FINDINGS: Lower chest: No acute abnormality. Three-vessel coronary artery calcifications. Hepatobiliary: No solid liver abnormality is seen. No gallstones, gallbladder wall thickening, or biliary dilatation. Pancreas: Unremarkable. No pancreatic ductal dilatation or surrounding inflammatory changes. Spleen: Normal in size without significant abnormality. Adrenals/Urinary Tract: Adrenal glands are unremarkable. Multiple nonobstructive left renal calculi. No hydronephrosis. Bladder is unremarkable. Stomach/Bowel: Stomach is within normal limits. Appendix appears normal. No evidence of bowel wall  thickening, distention, or inflammatory changes. Moderate burden of stool throughout the colon. Vascular/Lymphatic: Aortic atherosclerosis. No enlarged abdominal or pelvic lymph nodes. Reproductive: No mass or other significant abnormality. Other: Status post right inguinal hernia repair. No abdominopelvic ascites. Musculoskeletal: No acute or significant osseous findings. IMPRESSION: 1. No acute CT findings of the abdomen or pelvis to explain diarrhea or fever. Moderate burden of stool throughout the colon. 2.  Nonobstructive left nephrolithiasis. 3.  Coronary artery disease.  Aortic Atherosclerosis (ICD10-I70.0). Electronically Signed   By: Lauralyn PrimesAlex  Bibbey M.D.   On: 04/23/2020 14:19   DG Chest Port 1 View  Result Date: 04/23/2020 CLINICAL DATA:  Weakness EXAM: PORTABLE CHEST 1 VIEW COMPARISON:  None. FINDINGS: The heart size and mediastinal contours are within normal limits. Both lungs are clear. The visualized skeletal structures are unremarkable. IMPRESSION: No active disease. Electronically Signed   By: Jonna ClarkBindu  Avutu M.D.   On: 04/23/2020 00:43        Subjective: Patient is feeling better, no nausea or vomiting,   Discharge Exam: Vitals:   04/26/20 2207 04/27/20 0525  BP: 122/70 122/80  Pulse: 92 85  Resp: 18 15  Temp: 98.7 F (37.1  C) 98.7 F (37.1 C)  SpO2: 96% 97%   Vitals:   04/26/20 1643 04/26/20 2053 04/26/20 2207 04/27/20 0525  BP:   122/70 122/80  Pulse:  97 92 85  Resp:  16 18 15   Temp: 99.4 F (37.4 C)  98.7 F (37.1 C) 98.7 F (37.1 C)  TempSrc: Oral  Oral Oral  SpO2:  94% 96% 97%  Weight:      Height:        General: Not in pain or dyspnea  Neurology: Awake and alert, non focal  E ENT: no pallor, no icterus, oral mucosa moist Cardiovascular: No JVD. S1-S2 present, rhythmic, no gallops, rubs, or murmurs. No lower extremity edema. Pulmonary: vesicular breath sounds bilaterally, adequate air movement, no wheezing, rhonchi or rales. Gastrointestinal. Abdomen with  no organomegaly, non tender, no rebound or guarding.  Skin. No rashes Musculoskeletal: no joint deformities   The results of significant diagnostics from this hospitalization (including imaging, microbiology, ancillary and laboratory) are listed below for reference.     Microbiology: Recent Results (from the past 240 hour(s))  GI pathogen panel by PCR, stool     Status: None   Collection Time: 04/23/20 12:15 AM  Result Value Ref Range Status   Plesiomonas shigelloides NOT DETECTED NOT DETECTED Final   Yersinia enterocolitica NOT DETECTED NOT DETECTED Final   Vibrio NOT DETECTED NOT DETECTED Final   Enteropathogenic E coli NOT DETECTED NOT DETECTED Final   E coli (ETEC) LT/ST NOT DETECTED NOT DETECTED Final   E coli 0157 by PCR Not applicable NOT DETECTED Final   Cryptosporidium by PCR NOT DETECTED NOT DETECTED Final   Entamoeba histolytica NOT DETECTED NOT DETECTED Final   Adenovirus F 40/41 NOT DETECTED NOT DETECTED Final   Norovirus GI/GII NOT DETECTED NOT DETECTED Final   Sapovirus NOT DETECTED NOT DETECTED Final    Comment: (NOTE) Performed At: [facility] [address] [place], [place] [id number] MD Jolene Schimke    Vibrio cholerae NOT DETECTED NOT DETECTED Final   Campylobacter by PCR NOT DETECTED NOT DETECTED Final   Salmonella by PCR NOT DETECTED NOT DETECTED Final   E coli (STEC) NOT DETECTED NOT DETECTED Final   Enteroaggregative E coli NOT DETECTED NOT DETECTED Final   Shigella by PCR NOT DETECTED NOT DETECTED Final   Cyclospora cayetanensis NOT DETECTED NOT DETECTED Final   Astrovirus NOT DETECTED NOT DETECTED Final   G lamblia by PCR NOT DETECTED NOT DETECTED Final   Rotavirus A by PCR NOT DETECTED NOT DETECTED Final  C Difficile Quick Screen w PCR reflex     Status: None   Collection Time: 04/23/20 12:16 AM   Specimen: Stool  Result Value Ref Range Status   C Diff antigen NEGATIVE NEGATIVE Final   C Diff toxin NEGATIVE NEGATIVE Final    C Diff interpretation No C. difficile detected.  Final    Comment: Performed at [facility], [address] [place], [place]  SARS CORONAVIRUS 2 (TAT 6-24 HRS) Nasopharyngeal Nasopharyngeal Swab     Status: None   Collection Time: 04/23/20  2:04 AM   Specimen: Nasopharyngeal Swab  Result Value Ref Range Status   SARS Coronavirus 2 NEGATIVE NEGATIVE Final    Comment: (NOTE) SARS-CoV-2 target nucleic acids are NOT DETECTED. The SARS-CoV-2 RNA is generally detectable in upper and lower respiratory specimens during the acute phase of infection. Negative results do not preclude SARS-CoV-2 infection, do not rule out co-infections with other pathogens,  and should not be used as the sole basis for treatment or other patient management decisions. Negative results must be combined with clinical observations, patient history, and epidemiological information. The expected result is Negative. Fact Sheet for Patients: HairSlick.no Fact Sheet for Healthcare Providers: quierodirigir.com This test is not yet approved or cleared by the [place] FDA and  has been authorized for detection and/or diagnosis of SARS-CoV-2 by FDA under an Emergency Use Authorization (EUA). This EUA will remain  in effect (meaning this test can be used) for the duration of the COVID-19 declaration under Section 56 4(b)(1) of the Act, 21 U.S.C. section 360bbb-3(b)(1), unless the authorization is terminated or revoked sooner. Performed at [facility], [address], [place], [place] [address]   Blood culture (routine x 2)     Status: None (Preliminary result)   Collection Time: 04/23/20  2:30 AM   Specimen: BLOOD RIGHT HAND  Result Value Ref Range Status   Specimen Description   Final    BLOOD RIGHT HAND Performed at [facility], [address] [place], [place] [address]    Special Requests   Final     BOTTLES DRAWN AEROBIC AND ANAEROBIC Blood Culture adequate volume Performed at [facility], [address] [place], [place] [address]    Culture   Final    NO GROWTH 4 DAYS Performed at [facility], [address] [place], [place] [address]    Report Status PENDING  Incomplete  Blood culture (routine x 2)     Status: None (Preliminary result)   Collection Time: 04/23/20  2:30 AM   Specimen: BLOOD  Result Value Ref Range Status   Specimen Description   Final    BLOOD RIGHT ARM Performed at [facility], [address], [place], [place] [address]    Special Requests   Final    BOTTLES DRAWN AEROBIC AND ANAEROBIC Blood Culture adequate volume Performed at [facility], [address], [place], [place] [address]    Culture   Final    NO GROWTH 4 DAYS Performed at [facility], [address], [place], [place] [address]    Report Status PENDING  Incomplete     Labs: BNP (last 3 results) No results for input(s): BNP in the last 8760 hours. Basic Metabolic Panel: Recent Labs  Lab 04/23/20 0005 04/23/20 0005 04/24/20 0350 04/24/20 0350 04/24/20 2229 04/25/20 0357 04/25/20 2311 04/26/20 0232 04/27/20 0936  NA 140  --  139  --   --  143  --  142 138  K 3.9  --  3.9  --   --  3.4*  --  3.7 3.5  CL 107  --  113*  --   --  112*  --  108 103  CO2 18*  --  17*  --   --  21*  --  24 25  GLUCOSE 223*   < > 129*   < > 380* 202* 443* 322* 267*  BUN 25*  --  16  --   --  14  --  18 15  CREATININE 1.25*  --  0.90  --   --  1.06  --  1.26* 1.11  CALCIUM 10.0  --  8.5*  --   --  9.2  --  9.1 8.3*  MG  --   --  2.0  --   --  2.1  --   --   --    < > =  values in this interval not displayed.   Liver Function Tests: Recent Labs  Lab 04/23/20 0005 04/24/20 0350  AST 12* 13*  ALT 17 12  ALKPHOS 78 55  BILITOT 1.0 0.7  PROT 7.5 4.9*  ALBUMIN 2.8* 1.9*   Recent Labs  Lab 04/23/20 0005  LIPASE 26   No  results for input(s): AMMONIA in the last 168 hours. CBC: Recent Labs  Lab 04/23/20 0005 04/23/20 0005 04/23/20 0600 04/23/20 1134 04/24/20 0350 04/25/20 0357 04/26/20 0232  WBC 14.5*   < > 13.6* 12.3* 11.5* 13.4* 12.3*  NEUTROABS 11.2*  --  10.1*  --  8.1*  --  8.4*  HGB 13.4   < > 11.9* 11.4* 10.9* 12.1* 12.0*  HCT 42.7   < > 38.2* 36.5* 35.9* 37.9* 38.4*  MCV 97.0   < > 98.2 98.4 100.3* 95.5 95.8  PLT 414*   < > 382 346 336 452* 383   < > = values in this interval not displayed.   Cardiac Enzymes: No results for input(s): CKTOTAL, CKMB, CKMBINDEX, TROPONINI in the last 168 hours. BNP: Invalid input(s): POCBNP CBG: Recent Labs  Lab 04/26/20 1140 04/26/20 1639 04/26/20 2209 04/27/20 0714 04/27/20 1203  GLUCAP 302* 226* 234* 216* 292*   D-Dimer No results for input(s): DDIMER in the last 72 hours. Hgb A1c No results for input(s): HGBA1C in the last 72 hours. Lipid Profile No results for input(s): CHOL, HDL, LDLCALC, TRIG, CHOLHDL, LDLDIRECT in the last 72 hours. Thyroid function studies No results for input(s): TSH, T4TOTAL, T3FREE, THYROIDAB in the last 72 hours.  Invalid input(s): FREET3 Anemia work up No results for input(s): VITAMINB12, FOLATE, FERRITIN, TIBC, IRON, RETICCTPCT in the last 72 hours. Urinalysis    Component Value Date/Time   COLORURINE STRAW (A) 04/23/2020 0006   APPEARANCEUR CLEAR 04/23/2020 0006   LABSPEC 1.024 04/23/2020 0006   PHURINE 5.0 04/23/2020 0006   GLUCOSEU >=500 (A) 04/23/2020 0006   HGBUR NEGATIVE 04/23/2020 0006   BILIRUBINUR NEGATIVE 04/23/2020 0006   KETONESUR 80 (A) 04/23/2020 0006   PROTEINUR 30 (A) 04/23/2020 0006   NITRITE NEGATIVE 04/23/2020 0006   LEUKOCYTESUR NEGATIVE 04/23/2020 0006   Sepsis Labs Invalid input(s): PROCALCITONIN,  WBC,  LACTICIDVEN Microbiology Recent Results (from the past 240 hour(s))  GI pathogen panel by PCR, stool     Status: None   Collection Time: 04/23/20 12:15 AM  Result Value Ref  Range Status   Plesiomonas shigelloides NOT DETECTED NOT DETECTED Final   Yersinia enterocolitica NOT DETECTED NOT DETECTED Final   Vibrio NOT DETECTED NOT DETECTED Final   Enteropathogenic E coli NOT DETECTED NOT DETECTED Final   E coli (ETEC) LT/ST NOT DETECTED NOT DETECTED Final   E coli 0157 by PCR Not applicable NOT DETECTED Final   Cryptosporidium by PCR NOT DETECTED NOT DETECTED Final   Entamoeba histolytica NOT DETECTED NOT DETECTED Final   Adenovirus F 40/41 NOT DETECTED NOT DETECTED Final   Norovirus GI/GII NOT DETECTED NOT DETECTED Final   Sapovirus NOT DETECTED NOT DETECTED Final    Comment: (NOTE) Performed At: [facility] [address] [place], [place] [id number] Jolene Schimke MD SL:[id number]    Vibrio cholerae NOT DETECTED NOT DETECTED Final   Campylobacter by PCR NOT DETECTED NOT DETECTED Final   Salmonella by PCR NOT DETECTED NOT DETECTED Final   E coli (STEC) NOT DETECTED NOT DETECTED Final   Enteroaggregative E coli NOT DETECTED NOT DETECTED Final   Shigella by PCR NOT DETECTED  NOT DETECTED Final   Cyclospora cayetanensis NOT DETECTED NOT DETECTED Final   Astrovirus NOT DETECTED NOT DETECTED Final   G lamblia by PCR NOT DETECTED NOT DETECTED Final   Rotavirus A by PCR NOT DETECTED NOT DETECTED Final  C Difficile Quick Screen w PCR reflex     Status: None   Collection Time: 04/23/20 12:16 AM   Specimen: Stool  Result Value Ref Range Status   C Diff antigen NEGATIVE NEGATIVE Final   C Diff toxin NEGATIVE NEGATIVE Final   C Diff interpretation No C. difficile detected.  Final    Comment: Performed at [facility], [address] [place], [place] [address]  SARS CORONAVIRUS 2 (TAT 6-24 HRS) Nasopharyngeal Nasopharyngeal Swab     Status: None   Collection Time: 04/23/20  2:04 AM   Specimen: Nasopharyngeal Swab  Result Value Ref Range Status   SARS Coronavirus 2 NEGATIVE NEGATIVE Final    Comment: (NOTE) SARS-CoV-2 target nucleic  acids are NOT DETECTED. The SARS-CoV-2 RNA is generally detectable in upper and lower respiratory specimens during the acute phase of infection. Negative results do not preclude SARS-CoV-2 infection, do not rule out co-infections with other pathogens, and should not be used as the sole basis for treatment or other patient management decisions. Negative results must be combined with clinical observations, patient history, and epidemiological information. The expected result is Negative. Fact Sheet for Patients: HairSlick.no Fact Sheet for Healthcare Providers: quierodirigir.com This test is not yet approved or cleared by the [place] FDA and  has been authorized for detection and/or diagnosis of SARS-CoV-2 by FDA under an Emergency Use Authorization (EUA). This EUA will remain  in effect (meaning this test can be used) for the duration of the COVID-19 declaration under Section 56 4(b)(1) of the Act, 21 U.S.C. section 360bbb-3(b)(1), unless the authorization is terminated or revoked sooner. Performed at [facility], [address], [place], [place] [address]   Blood culture (routine x 2)     Status: None (Preliminary result)   Collection Time: 04/23/20  2:30 AM   Specimen: BLOOD RIGHT HAND  Result Value Ref Range Status   Specimen Description   Final    BLOOD RIGHT HAND Performed at [facility], [address], [place], [place] [address]    Special Requests   Final    BOTTLES DRAWN AEROBIC AND ANAEROBIC Blood Culture adequate volume Performed at [facility], [address] [place], [place] [address]    Culture   Final    NO GROWTH 4 DAYS Performed at [facility], [address] [place], [place] [address]    Report Status PENDING  Incomplete  Blood culture (routine x 2)     Status: None (Preliminary result)   Collection Time: 04/23/20  2:30 AM   Specimen: BLOOD   Result Value Ref Range Status   Specimen Description   Final    BLOOD RIGHT ARM Performed at [facility], [address], [place], [place] [address]    Special Requests   Final    BOTTLES DRAWN AEROBIC AND ANAEROBIC Blood Culture adequate volume Performed at [facility], [address], [place], [place] [address]    Culture   Final    NO GROWTH 4 DAYS Performed at [facility], [address] [place], [place] [address]    Report Status PENDING  Incomplete     Time coordinating discharge: 45 minutes  SIGNED:   Coralie Keens, MD  [facility] 04/27/2020, 12:44 PM

## 2020-04-28 LAB — CULTURE, BLOOD (ROUTINE X 2)
Culture: NO GROWTH
Culture: NO GROWTH
Special Requests: ADEQUATE
Special Requests: ADEQUATE

## 2021-09-23 DEATH — deceased

## 2021-11-30 IMAGING — CT CT HEAD W/O CM
3 series · 15 of 47 positions shown, 18 images · non-contrast
Comparison: None.

CLINICAL DATA: Altered mental status

EXAM:
CT HEAD WITHOUT CONTRAST
TECHNIQUE: Contiguous axial images were obtained from the base of the skull
through the vertex without intravenous contrast.

[Series 2: head wo · axial · 0.44mm/px · z∈[-132,-7]mm · 9 of 31 slices shown, 12 images]
[im 3/31  brain]
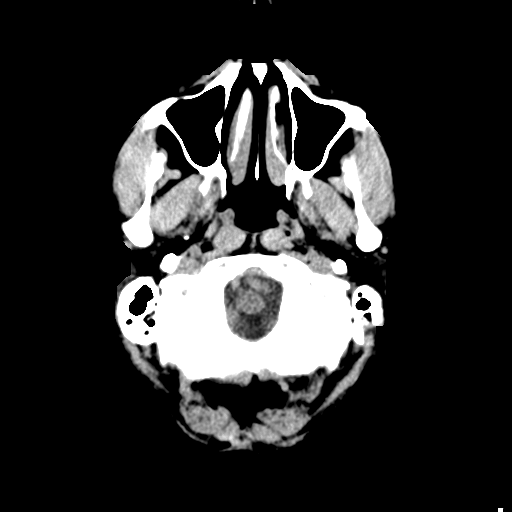
[im 3/31  bone]
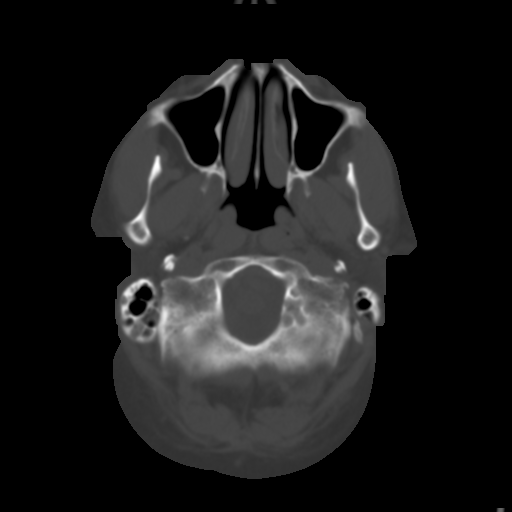
[im 6/31  brain]
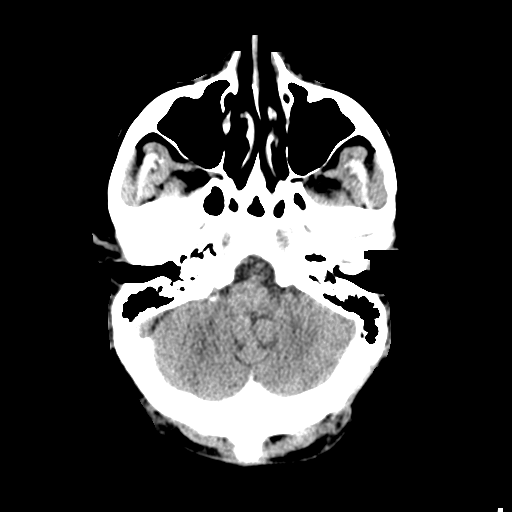
[im 9/31  brain]
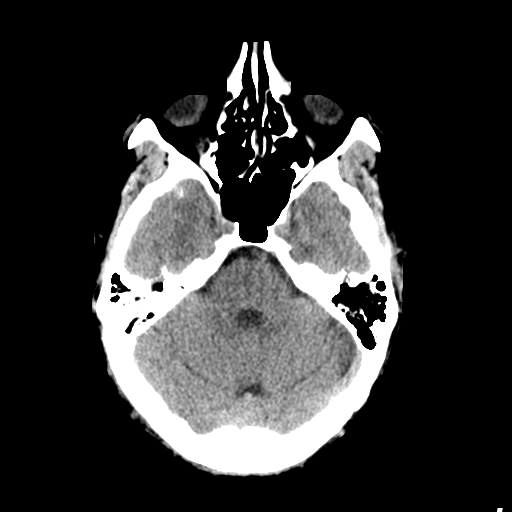
[im 12/31  brain]
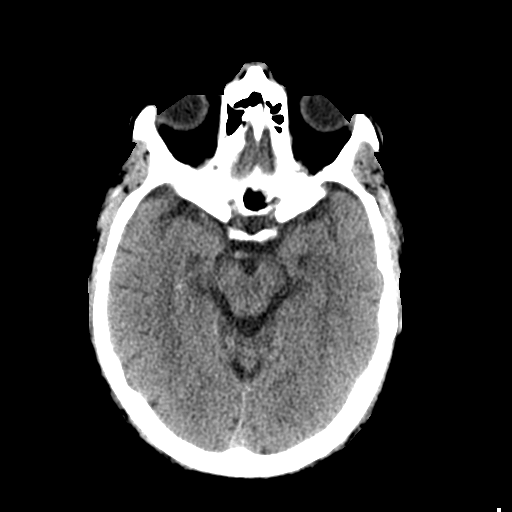
[im 16/31  brain]
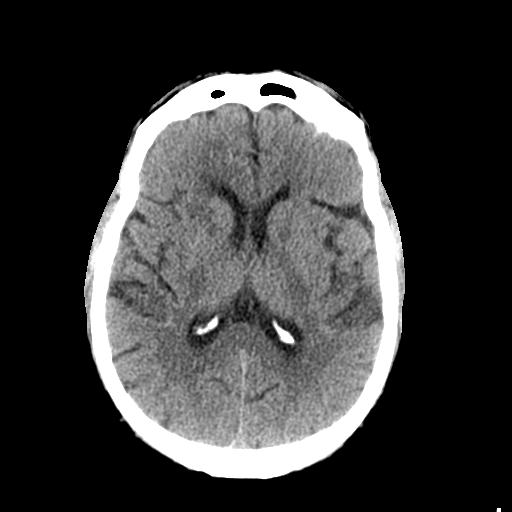
[im 16/31  bone]
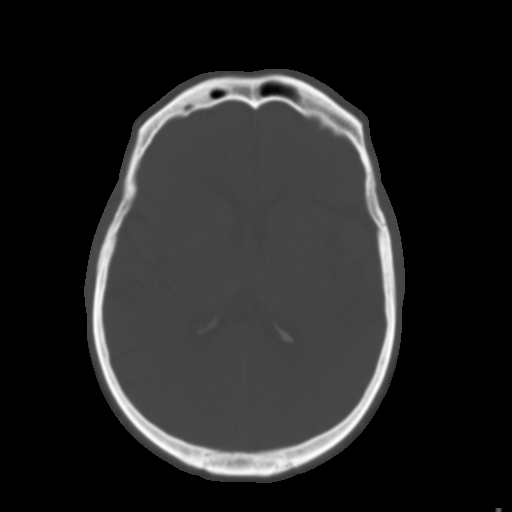
[im 19/31  brain]
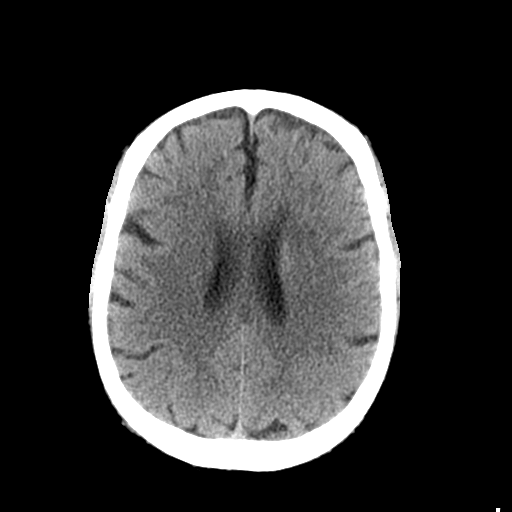
[im 22/31  brain]
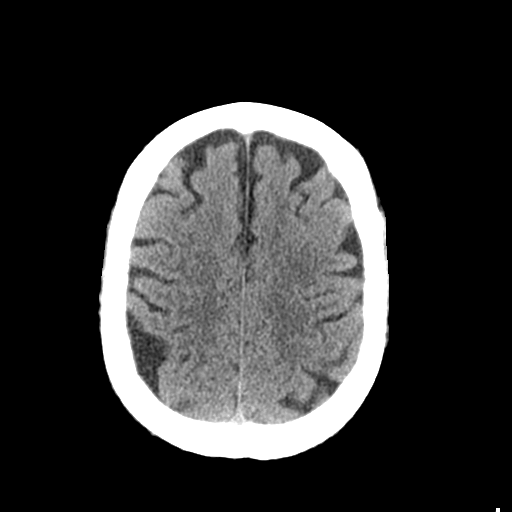
[im 25/31  brain]
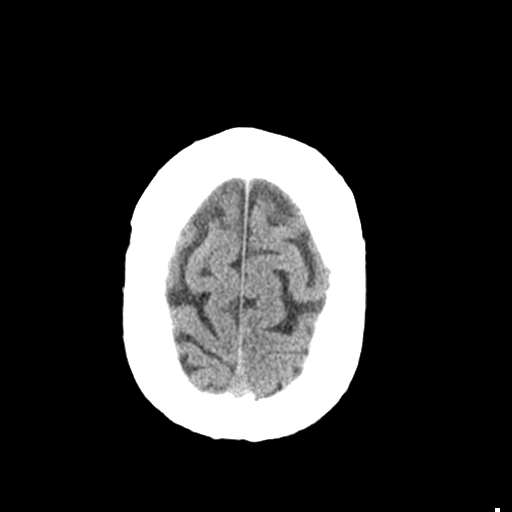
[im 28/31  brain]
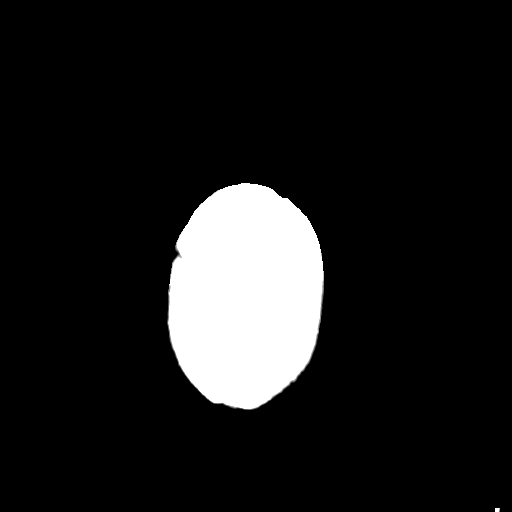
[im 28/31  bone]
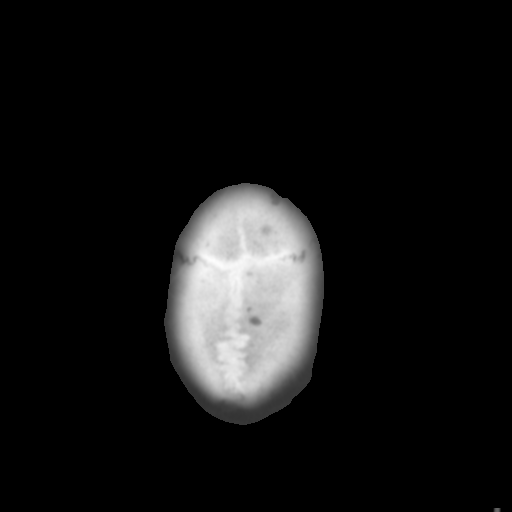

[Series 5: coronal soft tissue · coronal · 0.32mm/px · 3 of 76 slices shown]
[im 26/76  brain]
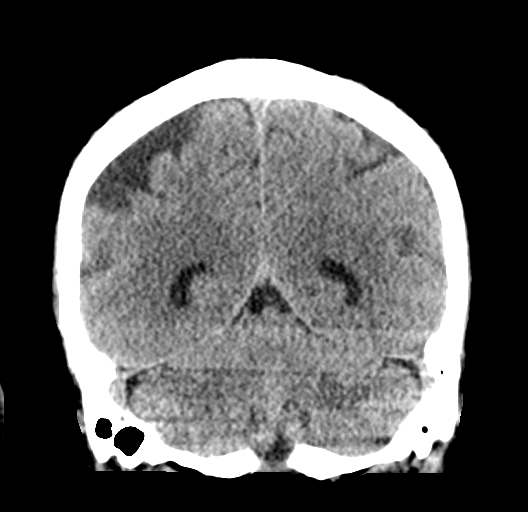
[im 34/76  brain]
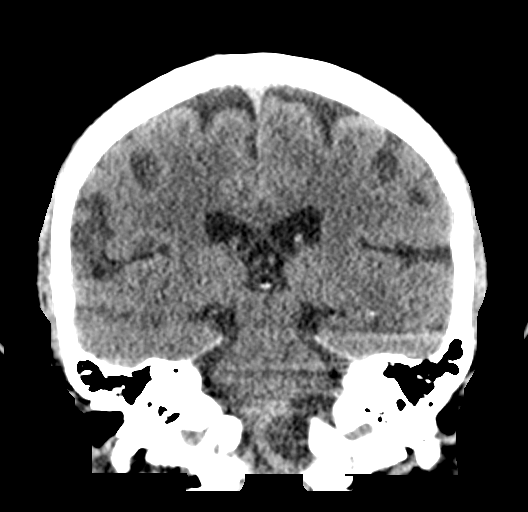
[im 42/76  brain]
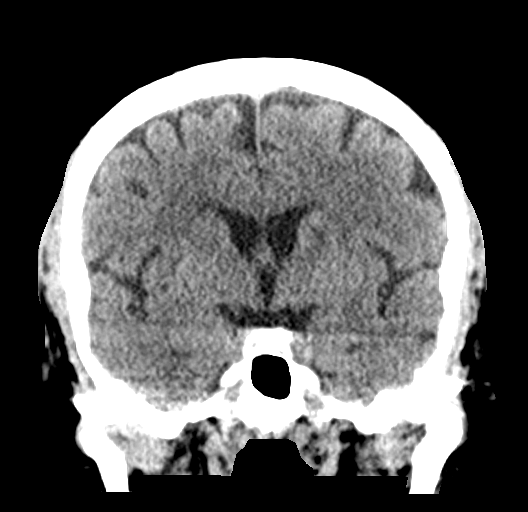

[Series 6: sagittal soft tissue · sagittal · 0.32mm/px · 3 of 56 slices shown]
[im 19/56  brain]
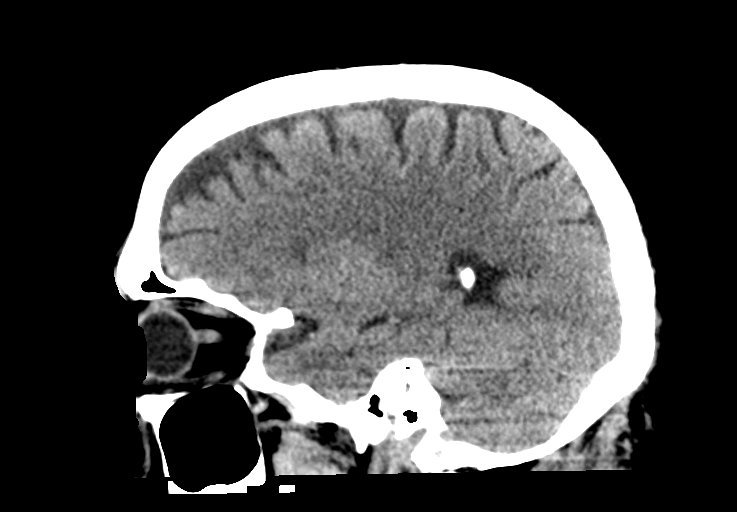
[im 28/56  brain]
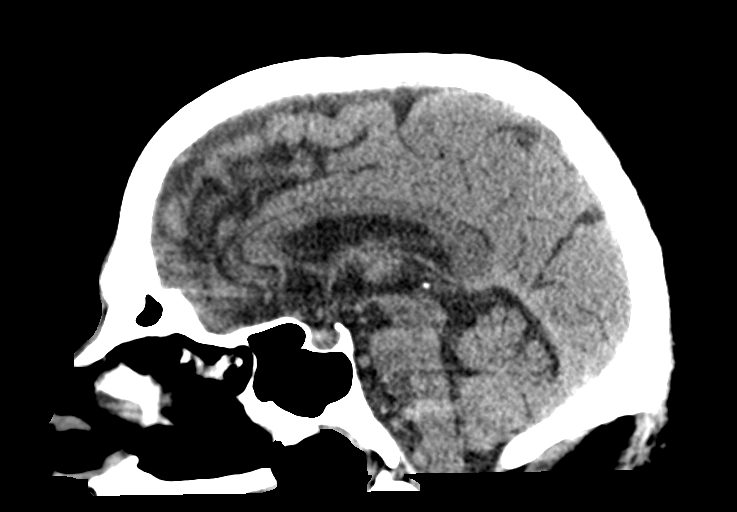
[im 37/56  brain]
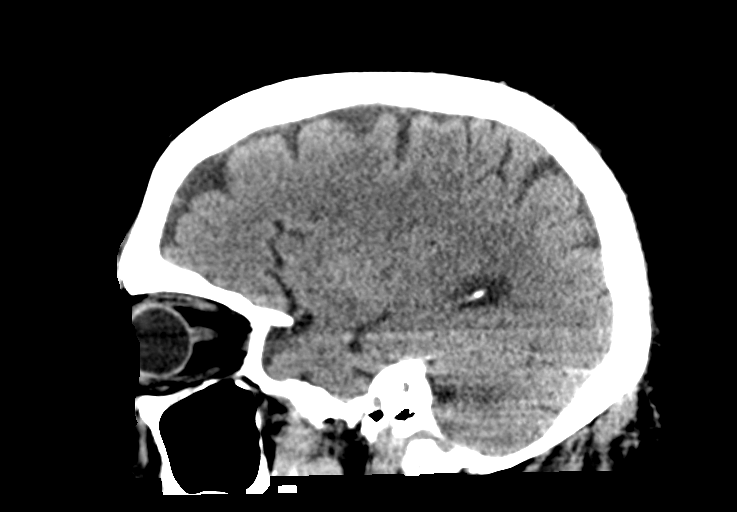

[15 of 47 positions shown; findings below may reference images not displayed]

FINDINGS: Brain: No evidence of acute territorial infarction, hemorrhage,
hydrocephalus,extra-axial collection or mass lesion/mass effect.
There is dilatation the ventricles and sulci consistent with
age-related atrophy. Low-attenuation changes in the deep white
matter consistent with small vessel ischemia. Prior lacunar infarct
involving the right basal ganglia.

Vascular: No hyperdense vessel or unexpected calcification.

Skull: The skull is intact. No fracture or focal lesion identified.

Sinuses/Orbits: The visualized paranasal sinuses and mastoid air
cells are clear. The orbits and globes intact.

Other: None
IMPRESSION: No acute intracranial abnormality.

Findings consistent with age related atrophy and chronic small
vessel ischemia

Prior lacunar infarct involving the right basal ganglia.
